# Patient Record
Sex: Male | Born: 1980 | Race: Black or African American | Hispanic: No | Marital: Single | Smoking: Current every day smoker
Health system: Southern US, Community
[De-identification: ages and names within clinical notes are randomized; demographics above are authoritative.]

---

## 2005-06-11 ENCOUNTER — Emergency Department: Payer: Self-pay | Admitting: Emergency Medicine

## 2005-12-03 ENCOUNTER — Other Ambulatory Visit: Payer: Self-pay

## 2005-12-03 ENCOUNTER — Emergency Department: Payer: Self-pay | Admitting: Internal Medicine

## 2008-10-11 ENCOUNTER — Emergency Department: Payer: Self-pay | Admitting: Emergency Medicine

## 2009-12-04 ENCOUNTER — Emergency Department: Payer: Self-pay | Admitting: Unknown Physician Specialty

## 2010-02-06 ENCOUNTER — Emergency Department: Payer: Self-pay | Admitting: Emergency Medicine

## 2012-08-02 ENCOUNTER — Emergency Department: Payer: Self-pay | Admitting: Emergency Medicine

## 2012-08-02 LAB — RAPID INFLUENZA A&B ANTIGENS

## 2013-09-23 ENCOUNTER — Emergency Department: Payer: Self-pay | Admitting: Emergency Medicine

## 2013-09-25 ENCOUNTER — Emergency Department: Payer: Self-pay | Admitting: Emergency Medicine

## 2013-09-25 LAB — URINALYSIS, COMPLETE
Bilirubin,UR: NEGATIVE
Blood: NEGATIVE
Glucose,UR: NEGATIVE mg/dL (ref 0–75)
Ketone: NEGATIVE
Leukocyte Esterase: NEGATIVE
NITRITE: NEGATIVE
Ph: 5 (ref 4.5–8.0)
Protein: 30
RBC,UR: 1 /HPF (ref 0–5)
SPECIFIC GRAVITY: 1.029 (ref 1.003–1.030)
Squamous Epithelial: 1
WBC UR: 3 /HPF (ref 0–5)

## 2013-09-25 LAB — CBC WITH DIFFERENTIAL/PLATELET
BASOS ABS: 0 10*3/uL (ref 0.0–0.1)
BASOS PCT: 0.4 %
EOS ABS: 0.1 10*3/uL (ref 0.0–0.7)
EOS PCT: 1.4 %
HCT: 50.7 % (ref 40.0–52.0)
HGB: 16.7 g/dL (ref 13.0–18.0)
LYMPHS ABS: 1 10*3/uL (ref 1.0–3.6)
LYMPHS PCT: 14.1 %
MCH: 30.7 pg (ref 26.0–34.0)
MCHC: 33 g/dL (ref 32.0–36.0)
MCV: 93 fL (ref 80–100)
MONOS PCT: 17 %
Monocyte #: 1.2 x10 3/mm — ABNORMAL HIGH (ref 0.2–1.0)
Neutrophil #: 4.9 10*3/uL (ref 1.4–6.5)
Neutrophil %: 67.1 %
Platelet: 251 10*3/uL (ref 150–440)
RBC: 5.46 10*6/uL (ref 4.40–5.90)
RDW: 12.7 % (ref 11.5–14.5)
WBC: 7.3 10*3/uL (ref 3.8–10.6)

## 2013-09-25 LAB — COMPREHENSIVE METABOLIC PANEL
AST: 25 U/L (ref 15–37)
Albumin: 4.1 g/dL (ref 3.4–5.0)
Alkaline Phosphatase: 98 U/L
Anion Gap: 6 — ABNORMAL LOW (ref 7–16)
BUN: 12 mg/dL (ref 7–18)
Bilirubin,Total: 0.9 mg/dL (ref 0.2–1.0)
CALCIUM: 9.4 mg/dL (ref 8.5–10.1)
CREATININE: 1.36 mg/dL — AB (ref 0.60–1.30)
Chloride: 101 mmol/L (ref 98–107)
Co2: 28 mmol/L (ref 21–32)
EGFR (African American): >60
EGFR (Non-African Amer.): >60
GLUCOSE: 105 mg/dL — AB (ref 65–99)
Osmolality: 270 (ref 275–301)
Potassium: 3.5 mmol/L (ref 3.5–5.1)
SGPT (ALT): 36 U/L (ref 12–78)
Sodium: 135 mmol/L — ABNORMAL LOW (ref 136–145)
Total Protein: 8.3 g/dL — ABNORMAL HIGH (ref 6.4–8.2)

## 2013-09-25 LAB — TROPONIN I

## 2013-09-25 LAB — LIPASE, BLOOD: Lipase: 147 U/L (ref 73–393)

## 2018-07-20 ENCOUNTER — Emergency Department: Payer: Commercial Managed Care - PPO

## 2018-07-20 ENCOUNTER — Emergency Department
Admission: EM | Admit: 2018-07-20 | Discharge: 2018-07-20 | Disposition: A | Discharge status: Home / Self Care | Payer: Commercial Managed Care - PPO | Attending: Emergency Medicine | Admitting: Emergency Medicine

## 2018-07-20 ENCOUNTER — Encounter: Payer: Self-pay | Admitting: Medical Oncology

## 2018-07-20 DIAGNOSIS — J189 Pneumonia, unspecified organism: Secondary | ICD-10-CM | POA: Diagnosis not present

## 2018-07-20 DIAGNOSIS — J111 Influenza due to unidentified influenza virus with other respiratory manifestations: Secondary | ICD-10-CM | POA: Insufficient documentation

## 2018-07-20 DIAGNOSIS — R05 Cough: Secondary | ICD-10-CM | POA: Diagnosis present

## 2018-07-20 DIAGNOSIS — J101 Influenza due to other identified influenza virus with other respiratory manifestations: Secondary | ICD-10-CM

## 2018-07-20 LAB — COMPREHENSIVE METABOLIC PANEL
ALT: 37 U/L (ref 0–44)
AST: 75 U/L — ABNORMAL HIGH (ref 15–41)
Albumin: 3.6 g/dL (ref 3.5–5.0)
Alkaline Phosphatase: 74 U/L (ref 38–126)
Anion gap: 6 (ref 5–15)
BUN: 12 mg/dL (ref 6–20)
CO2: 25 mmol/L (ref 22–32)
Calcium: 8.5 mg/dL — ABNORMAL LOW (ref 8.9–10.3)
Chloride: 102 mmol/L (ref 98–111)
Creatinine, Ser: 1.09 mg/dL (ref 0.61–1.24)
GFR calc Af Amer: >60 mL/min (ref >60)
GFR calc non Af Amer: >60 mL/min (ref >60)
GLUCOSE: 99 mg/dL (ref 70–99)
Potassium: 3.6 mmol/L (ref 3.5–5.1)
Sodium: 133 mmol/L — ABNORMAL LOW (ref 135–145)
TOTAL PROTEIN: 7.7 g/dL (ref 6.5–8.1)
Total Bilirubin: 1.1 mg/dL (ref 0.3–1.2)

## 2018-07-20 LAB — CBC
HCT: 42.7 % (ref 39.0–52.0)
Hemoglobin: 14.3 g/dL (ref 13.0–17.0)
MCH: 30.5 pg (ref 26.0–34.0)
MCHC: 33.5 g/dL (ref 30.0–36.0)
MCV: 91 fL (ref 80.0–100.0)
Platelets: 234 10*3/uL (ref 150–400)
RBC: 4.69 MIL/uL (ref 4.22–5.81)
RDW: 11.4 % — ABNORMAL LOW (ref 11.5–15.5)
WBC: 8.6 10*3/uL (ref 4.0–10.5)
nRBC: 0 % (ref 0.0–0.2)

## 2018-07-20 LAB — INFLUENZA PANEL BY PCR (TYPE A & B)
INFLBPCR: NEGATIVE
Influenza A By PCR: POSITIVE — AB

## 2018-07-20 LAB — TROPONIN I: Troponin I: <0.03 ng/mL (ref <0.03)

## 2018-07-20 MED ORDER — ACETAMINOPHEN 500 MG PO TABS
1000.0000 mg | ORAL_TABLET | Freq: Once | ORAL | Status: AC
  Administered 2018-07-20: 1000 mg via ORAL
  Filled 2018-07-20: qty 2

## 2018-07-20 MED ORDER — KETOROLAC TROMETHAMINE 30 MG/ML IJ SOLN
30.0000 mg | Freq: Once | INTRAMUSCULAR | Status: AC
  Administered 2018-07-20: 30 mg via INTRAVENOUS
  Filled 2018-07-20: qty 1

## 2018-07-20 MED ORDER — ONDANSETRON 4 MG PO TBDP: 4.0000 mg | ORAL_TABLET | Freq: Three times a day (TID) | ORAL | 0 refills | Status: DC | PRN

## 2018-07-20 MED ORDER — ONDANSETRON HCL 4 MG/2ML IJ SOLN
4.0000 mg | Freq: Once | INTRAMUSCULAR | Status: AC
  Administered 2018-07-20: 4 mg via INTRAVENOUS
  Filled 2018-07-20: qty 2

## 2018-07-20 MED ORDER — GUAIFENESIN-CODEINE 100-10 MG/5ML PO SOLN: 5.0000 mL | Freq: Four times a day (QID) | ORAL | 0 refills | Status: DC | PRN

## 2018-07-20 MED ORDER — AZITHROMYCIN 250 MG PO TABS: ORAL_TABLET | ORAL | 0 refills | Status: AC

## 2018-07-20 MED ORDER — OSELTAMIVIR PHOSPHATE 75 MG PO CAPS: 75.0000 mg | ORAL_CAPSULE | Freq: Two times a day (BID) | ORAL | 0 refills | Status: AC

## 2018-07-20 MED ORDER — SODIUM CHLORIDE 0.9 % IV BOLUS
1000.0000 mL | Freq: Once | INTRAVENOUS | Status: AC
  Administered 2018-07-20: 1000 mL via INTRAVENOUS

## 2018-07-20 NOTE — ED Triage Notes (Signed)
Pt reports that he began a couple weeks ago with cold sx's. States that this am he began having NVD and felt like he was going to pass out. Pt also reports generalized chest pain. Multiple complaints.

## 2018-07-20 NOTE — Discharge Instructions (Addendum)
As we discussed please take your antibiotic and Tamiflu for the next 5 days as written.  Please use your cough medication and nausea medication as needed, but only as prescribed.  Please use Tylenol or ibuprofen every 6 hours at home for fever or discomfort.  Please drink plenty of fluids.  Please avoid contact with others until your fever is gone.

## 2018-07-20 NOTE — ED Provider Notes (Signed)
[facility] Emergency Department Provider Note  Time seen: 7:45 AM  I have reviewed the triage vital signs and the nursing notes.   HISTORY  Chief Complaint Generalized Body Aches; Emesis; Dizziness; and Chest Pain    HPI Craig Watkins is a 38 y.o. male no significant past medical history presents to the emergency department for various complaints of cough, congestion, chest discomfort, nausea vomiting subjective fever.  According to the patient for the past 2 weeks he has had cold-like symptoms with cough and congestion, states he thought he was getting better until 2 days ago when he began with fever, vomiting, worsening cough and developed mild chest discomfort as well as body aches.  States his fiance developed similar symptoms 3 to 4 days ago and was diagnosed with influenza.  States his son is at home and developed a fever this morning.   History reviewed. No pertinent past medical history.  There are no active problems to display for this patient.   Prior to Admission medications   Not on File    No Known Allergies  No family history on file.  Social History Social History   Tobacco Use  . Smoking status: Not on file  Substance Use Topics  . Alcohol use: Not on file  . Drug use: Not on file    Review of Systems Constitutional: Subjective fever since yesterday ENT: Positive for congestion x2 weeks Cardiovascular: Mild chest discomfort Respiratory: Mild shortness of breath.  Positive for frequent cough. Gastrointestinal: Negative for abdominal pain positive for vomiting.  Loose stool. Genitourinary: Negative for urinary compaints Musculoskeletal: Negative for musculoskeletal complaints Skin: Negative for skin complaints  Neurological: Negative for headache All other ROS negative  ____________________________________________   PHYSICAL EXAM:  VITAL SIGNS: ED Triage Vitals  Enc Vitals Group     BP 07/20/18 0734 137/85   Pulse Rate 07/20/18 0734 (!) 114     Resp 07/20/18 0734 18     Temp 07/20/18 0734 99.7 F (37.6 C)     Temp Source 07/20/18 0734 Oral     SpO2 07/20/18 0734 97 %     Weight 07/20/18 0735 200 lb (90.7 kg)     Height 07/20/18 0735 6' (1.829 m)     Head Circumference --      Peak Flow --      Pain Score 07/20/18 0735 7     Pain Loc --      Pain Edu? --      Excl. in GC? --    Constitutional: Alert and oriented. Well appearing and in no distress. Eyes: Normal exam ENT   Head: Normocephalic and atraumatic.   Mouth/Throat: Mucous membranes are moist. Cardiovascular: Normal rate, regular rhythm around 100 bpm.  No obvious murmur. Respiratory: Normal respiratory effort without tachypnea nor retractions.  Frequent cough during exam no obvious rhonchi. Gastrointestinal: Soft and nontender. No distention. Musculoskeletal: Nontender with normal range of motion in all extremities.  Neurologic:  Normal speech and language. No gross focal neurologic deficits are appreciated. Skin:  Skin is warm, dry and intact.  Psychiatric: Mood and affect are normal. Speech and behavior are normal.   ____________________________________________    EKG  EKG viewed and interpreted by myself shows sinus tachycardia 114 bpm with a narrow QRS, normal axis, normal intervals, no concerning ST changes  ____________________________________________    RADIOLOGY  Patchy opacities favoring atypical pneumonia  ____________________________________________   INITIAL IMPRESSION / ASSESSMENT AND PLAN / ED COURSE  Pertinent labs &  imaging results that were available during my care of the patient were reviewed by me and considered in my medical decision making (see chart for details).  Patient presents emergency department with complaints of cough, congestion, body aches, chest pain, nausea vomiting, loose stool subjective fever.  Differential would include influenza, viral syndrome, pneumonia, URI, ACS.  We  will check labs, treat with Toradol Zofran and Tylenol.  We will IV hydrate and continue to closely monitor while awaiting results.  Patient agreeable to plan of care.  Patient's lab results show influenza A positive.  Patient's chest x-ray shows atypical pneumonia, possible viral pneumonia.  Overall patient states he feels much better.  We will plan to discharge home with Tamiflu, cover with Zithromax, discussed supportive care I also discussed return precautions for any trouble breathing or worsening condition.  Patient agreeable to plan of care  ____________________________________________   FINAL CLINICAL IMPRESSION(S) / ED DIAGNOSES  Influenza Pneumonia   Minna Antis, MD 07/20/18 [phone]

## 2019-02-02 ENCOUNTER — Other Ambulatory Visit: Payer: Self-pay

## 2019-02-02 DIAGNOSIS — Z20822 Contact with and (suspected) exposure to covid-19: Secondary | ICD-10-CM

## 2019-02-04 LAB — NOVEL CORONAVIRUS, NAA: SARS-CoV-2, NAA: NOT DETECTED

## 2020-02-19 ENCOUNTER — Emergency Department: Payer: Commercial Managed Care - PPO

## 2020-02-19 ENCOUNTER — Other Ambulatory Visit: Payer: Self-pay

## 2020-02-19 DIAGNOSIS — Y999 Unspecified external cause status: Secondary | ICD-10-CM | POA: Diagnosis not present

## 2020-02-19 DIAGNOSIS — Z041 Encounter for examination and observation following transport accident: Secondary | ICD-10-CM | POA: Diagnosis not present

## 2020-02-19 DIAGNOSIS — M545 Low back pain: Secondary | ICD-10-CM | POA: Insufficient documentation

## 2020-02-19 DIAGNOSIS — Y9389 Activity, other specified: Secondary | ICD-10-CM | POA: Insufficient documentation
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
## 2020-02-20 ENCOUNTER — Emergency Department: Payer: Commercial Managed Care - PPO

## 2020-02-20 ENCOUNTER — Emergency Department
Admission: EM | Admit: 2020-02-20 | Discharge: 2020-02-20 | Disposition: A | Discharge status: Home / Self Care | Payer: Commercial Managed Care - PPO | Attending: Emergency Medicine | Admitting: Emergency Medicine

## 2020-02-20 ENCOUNTER — Encounter: Payer: Self-pay | Admitting: Emergency Medicine

## 2020-02-20 DIAGNOSIS — M545 Low back pain, unspecified: Secondary | ICD-10-CM

## 2020-02-20 MED ORDER — LIDOCAINE 5 % EX PTCH: 1.0000 | MEDICATED_PATCH | Freq: Two times a day (BID) | CUTANEOUS | 0 refills | Status: AC

## 2020-02-20 MED ORDER — CYCLOBENZAPRINE HCL 5 MG PO TABS: 5.0000 mg | ORAL_TABLET | Freq: Three times a day (TID) | ORAL | 0 refills | Status: DC | PRN
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
## 2020-04-12 IMAGING — CR DG CHEST 2V
2 series · 2 of 2 positions shown · non-contrast
Comparison: 12/03/2005

CLINICAL DATA: Chest pain that began a few weeks ago with cold
symptoms. Nausea and vomiting with diarrhea.

EXAM:
CHEST - 2 VIEW

[chest pa]
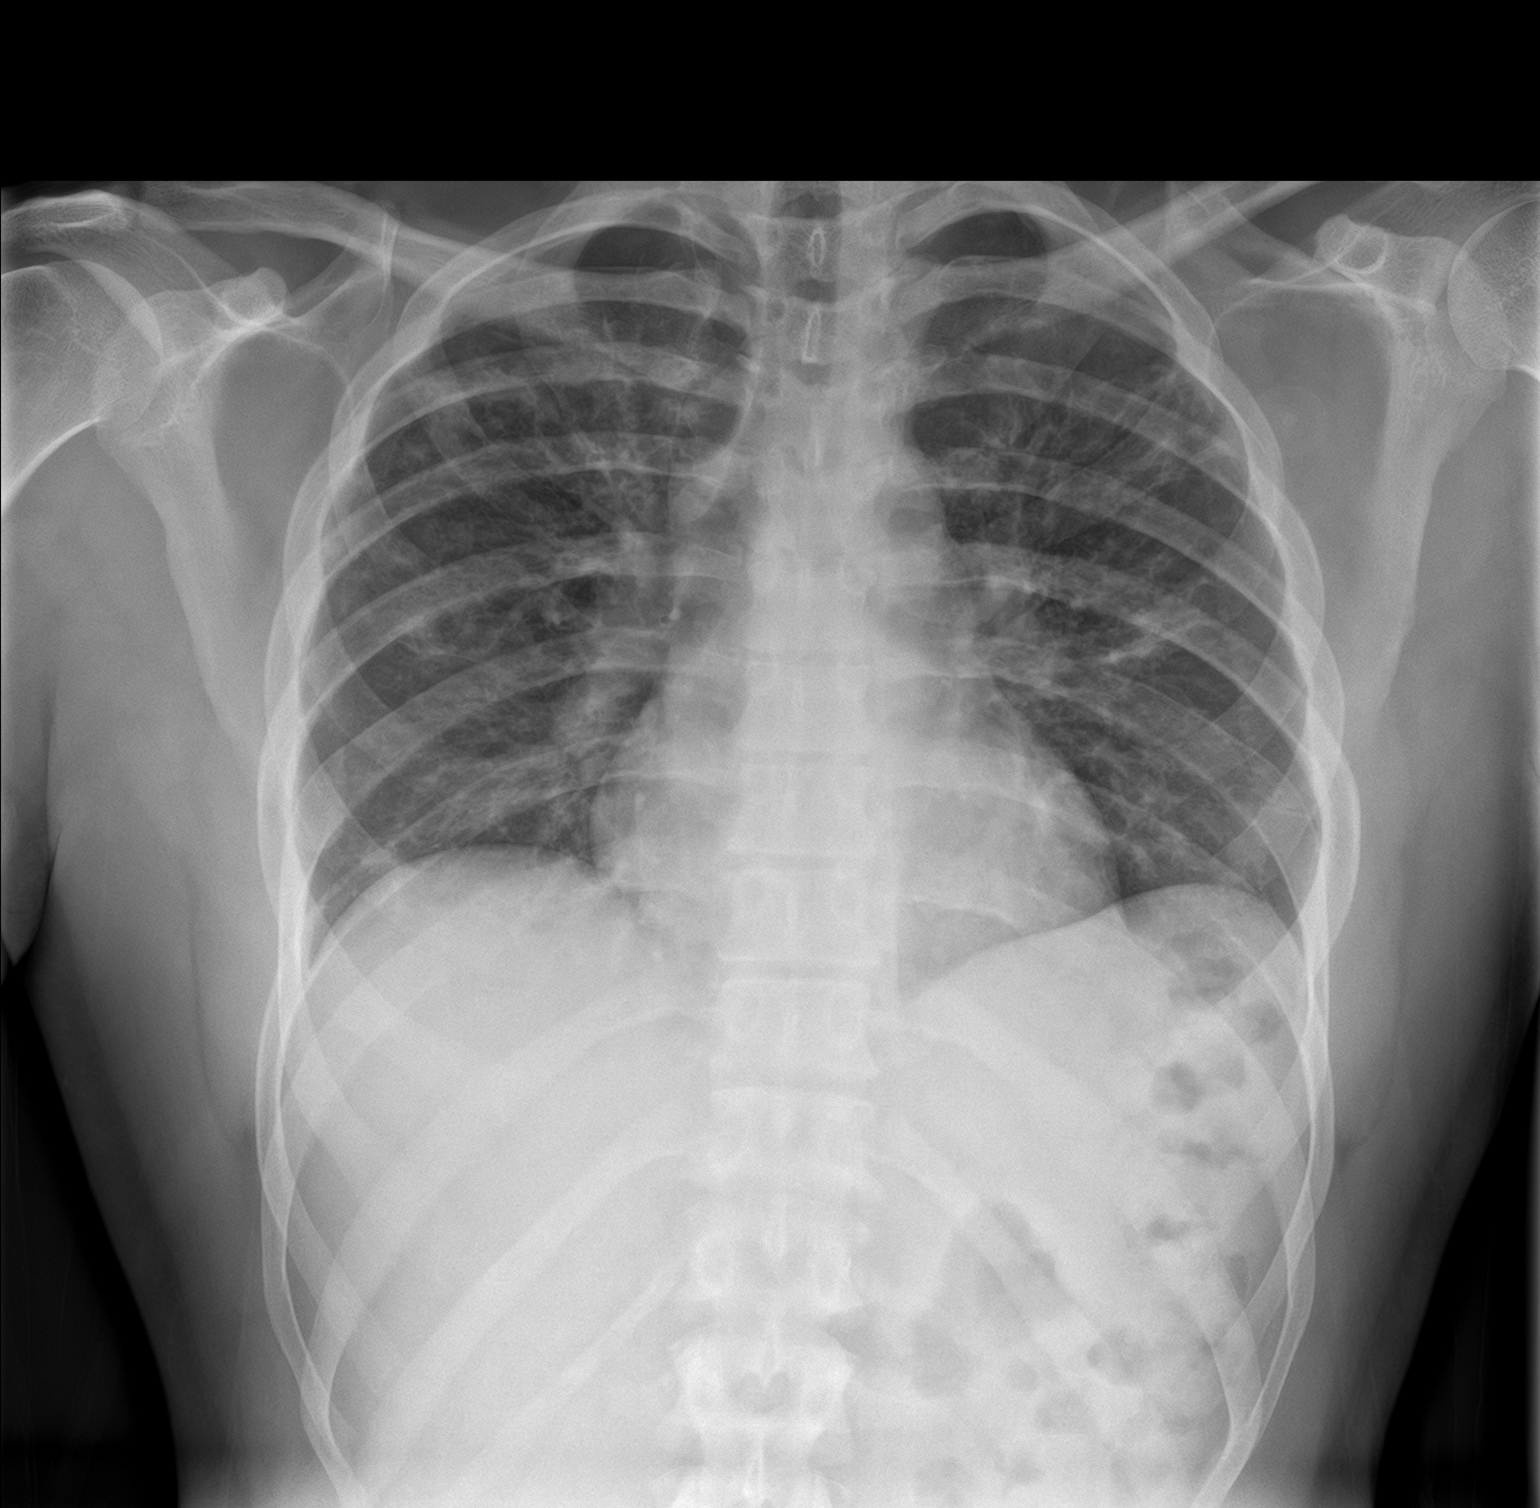

[chest lat]
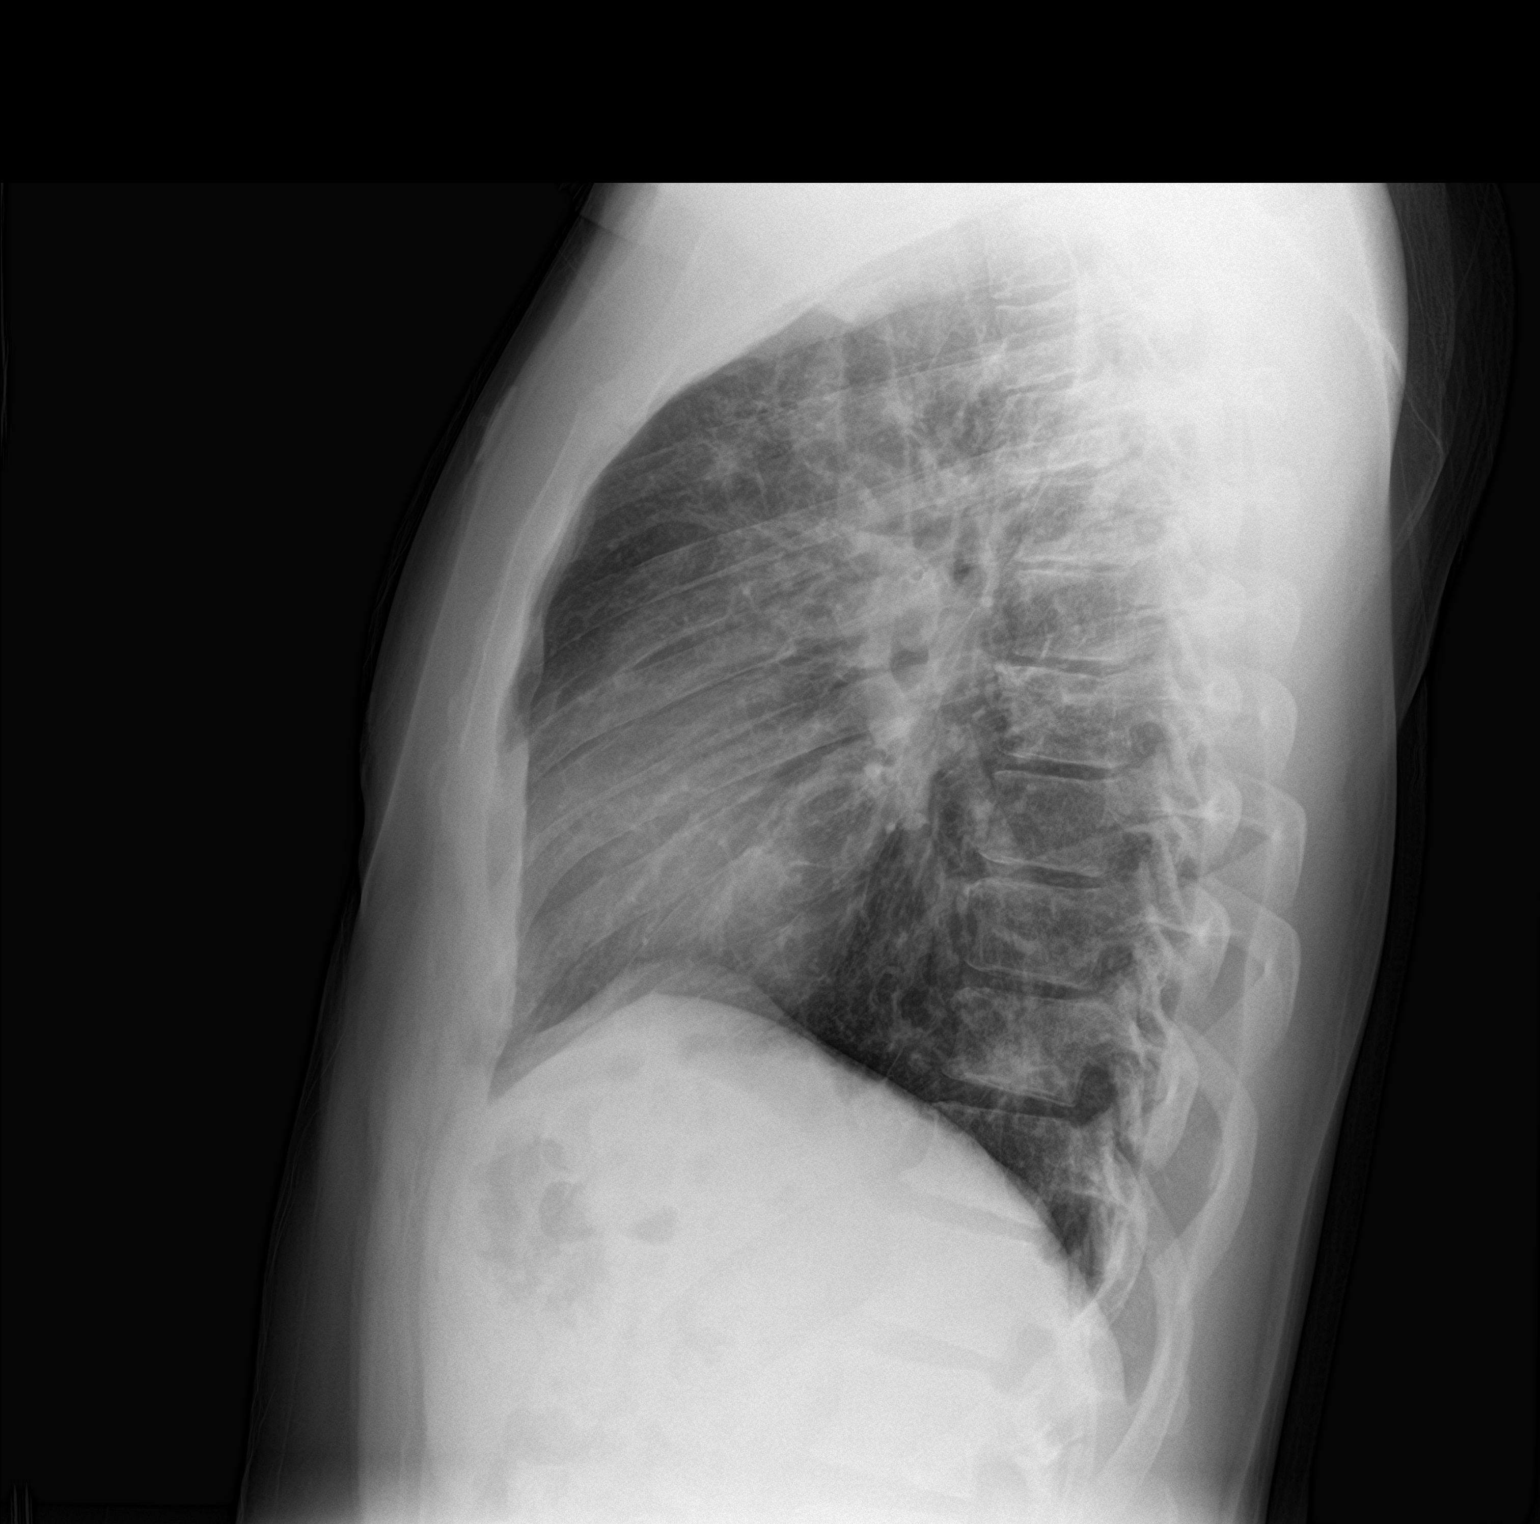

[2 of 2 positions shown; findings below may reference images not displayed]

FINDINGS: Patchy, streaky bilateral lung opacity. No Kerley lines, effusion,
or visible cavitation. Normal heart size and mediastinal contours.
IMPRESSION: Patchy bilateral lung opacity, favor atypical pneumonia in the
appropriate clinical setting.
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
## 2023-01-14 ENCOUNTER — Emergency Department
Admission: EM | Admit: 2023-01-14 | Discharge: 2023-01-14 | Disposition: A | Discharge status: Home / Self Care | Payer: BC Managed Care – PPO | Attending: Emergency Medicine | Admitting: Emergency Medicine

## 2023-01-14 ENCOUNTER — Encounter: Payer: Self-pay | Admitting: Emergency Medicine

## 2023-01-14 ENCOUNTER — Other Ambulatory Visit: Payer: Self-pay

## 2023-01-14 DIAGNOSIS — M722 Plantar fascial fibromatosis: Secondary | ICD-10-CM | POA: Insufficient documentation

## 2023-01-14 DIAGNOSIS — M79672 Pain in left foot: Secondary | ICD-10-CM | POA: Diagnosis present

## 2023-01-14 MED ORDER — HYDROCODONE-ACETAMINOPHEN 5-325 MG PO TABS: 1.0000 | ORAL_TABLET | Freq: Four times a day (QID) | ORAL | 0 refills | Status: AC | PRN

## 2023-01-14 MED ORDER — PREDNISONE 10 MG (21) PO TBPK: ORAL_TABLET | ORAL | 0 refills | Status: DC

## 2023-01-14 NOTE — ED Provider Notes (Signed)
[facility] Provider Note    Event Date/Time   First MD Initiated Contact with Patient 01/14/23 1005     (approximate)   History   Foot Pain   HPI  Craig Watkins is a 42 y.o. male with no significant past medical history and as listed in EMR presents to the emergency department for treatment and evaluation of nontraumatic left foot pain that started about 3 days ago.  Pain has significantly worsened overnight.  Pain worsens at rest and is extremely painful when he puts his foot on the floor in the morning.  He works 2 separate jobs loading boxes.  He denies injury.  No relief with over-the-counter medications..      Physical Exam   Triage Vital Signs: ED Triage Vitals  Enc Vitals Group     BP 01/14/23 0949 (!) 154/93     Pulse Rate 01/14/23 0949 79     Resp 01/14/23 0949 16     Temp --      Temp src --      SpO2 01/14/23 0949 96 %     Weight 01/14/23 0941 187 lb (84.8 kg)     Height 01/14/23 0941 6' (1.829 m)     Head Circumference --      Peak Flow --      Pain Score 01/14/23 0941 10     Pain Loc --      Pain Edu? --      Excl. in GC? --     Most recent vital signs: Vitals:   01/14/23 0949 01/14/23 1035  BP: (!) 154/93 134/87  Pulse: 79 72  Resp: 16   SpO2: 96% 100%    General: Awake, no distress.  CV:  Good peripheral perfusion.  Resp:  Normal effort.  Abd:  No distention.  Other:  Left foot pain induced with heel squeeze.  No open wounds or injury.  No areas of erythema.  Patient is able to demonstrate range of motion of the left ankle and toes.   ED Results / Procedures / Treatments   Labs (all labs ordered are listed, but only abnormal results are displayed) Labs Reviewed - No data to display   EKG  Not indicated   RADIOLOGY  Image and radiology report reviewed and interpreted by me. Radiology report consistent with the same.  Not indicated  PROCEDURES:  Critical Care performed:  No  Procedures   MEDICATIONS ORDERED IN ED:  Medications - No data to display   IMPRESSION / MDM / ASSESSMENT AND PLAN / ED COURSE   I have reviewed the triage note.  Differential diagnosis includes, but is not limited to, tendinitis, plantar fasciitis, gout  Patient's presentation is most consistent with acute, uncomplicated illness.  42 year old male presenting to the emergency department for treatment and evaluation of foot pain.  See HPI for further details.  On exam, he does have pain with heel squeeze.  Area of pain is from midfoot to heel.  Joints of the left foot are without erythema or indication of gout.  No open wounds or lesions are noted.  Symptoms most consistent with plantar fasciitis.  This was discussed in length with the patient.  It was recommended that he purchase a night splint and will get insoles for his work boots.  He will be prescribed meloxicam to take daily and a short course of Norco to take if he is not working and pain is severe.  He was advised not  to take any additional NSAIDs but was advised that he can take Tylenol.  He will be given a referral to podiatry and was strongly encouraged to schedule an appointment.      FINAL CLINICAL IMPRESSION(S) / ED DIAGNOSES   Final diagnoses:  Plantar fasciitis     Rx / DC Orders   ED Discharge Orders          Ordered    predniSONE (STERAPRED UNI-PAK 21 TAB) 10 MG (21) TBPK tablet        01/14/23 1045    HYDROcodone-acetaminophen (NORCO/VICODIN) 5-325 MG tablet  Every 6 hours PRN        01/14/23 1045             Note:  This document was prepared using Dragon voice recognition software and may include unintentional dictation errors.   Chinita Pester, FNP 01/14/23 1427    Jene Every, MD 01/14/23 1510

## 2023-01-14 NOTE — ED Triage Notes (Signed)
Pt c/o left foot pain that started 3 days ago but got worse last night. Pt denies any injury to the area. Pt is in NAD.

## 2023-01-14 NOTE — ED Notes (Signed)
41 yom with a c/c of left sided foot pain since Saturday. The pt advised he is hurting in the arch of his foot. The pt denies any injury to his foot.

## 2024-07-24 ENCOUNTER — Other Ambulatory Visit: Payer: Self-pay

## 2024-07-24 ENCOUNTER — Emergency Department: Payer: Self-pay

## 2024-07-24 ENCOUNTER — Emergency Department
Admission: EM | Admit: 2024-07-24 | Discharge: 2024-07-24 | Disposition: A | Discharge status: Home / Self Care | Payer: Self-pay | Attending: Emergency Medicine | Admitting: Emergency Medicine

## 2024-07-24 DIAGNOSIS — R5383 Other fatigue: Secondary | ICD-10-CM | POA: Diagnosis not present

## 2024-07-24 DIAGNOSIS — I2694 Multiple subsegmental pulmonary emboli without acute cor pulmonale: Secondary | ICD-10-CM | POA: Diagnosis not present

## 2024-07-24 DIAGNOSIS — F17200 Nicotine dependence, unspecified, uncomplicated: Secondary | ICD-10-CM | POA: Insufficient documentation

## 2024-07-24 DIAGNOSIS — J189 Pneumonia, unspecified organism: Secondary | ICD-10-CM | POA: Insufficient documentation

## 2024-07-24 DIAGNOSIS — R0789 Other chest pain: Secondary | ICD-10-CM | POA: Diagnosis present

## 2024-07-24 LAB — CBC
HCT: 45.6 % (ref 39.0–52.0)
Hemoglobin: 15.2 g/dL (ref 13.0–17.0)
MCH: 31.1 pg (ref 26.0–34.0)
MCHC: 33.3 g/dL (ref 30.0–36.0)
MCV: 93.4 fL (ref 80.0–100.0)
Platelets: 265 K/uL (ref 150–400)
RBC: 4.88 MIL/uL (ref 4.22–5.81)
RDW: 12 % (ref 11.5–15.5)
WBC: 7.5 K/uL (ref 4.0–10.5)
nRBC: 0 % (ref 0.0–0.2)

## 2024-07-24 LAB — RESP PANEL BY RT-PCR (RSV, FLU A&B, COVID)  RVPGX2
Influenza A by PCR: NEGATIVE
Influenza B by PCR: NEGATIVE
Resp Syncytial Virus by PCR: NEGATIVE
SARS Coronavirus 2 by RT PCR: NEGATIVE

## 2024-07-24 LAB — BASIC METABOLIC PANEL WITH GFR
Anion gap: 10 (ref 5–15)
BUN: 14 mg/dL (ref 6–20)
CO2: 24 mmol/L (ref 22–32)
Calcium: 9.1 mg/dL (ref 8.9–10.3)
Chloride: 104 mmol/L (ref 98–111)
Creatinine, Ser: 1.15 mg/dL (ref 0.61–1.24)
GFR, Estimated: >60 mL/min
Glucose, Bld: 122 mg/dL — ABNORMAL HIGH (ref 70–99)
Potassium: 4.1 mmol/L (ref 3.5–5.1)
Sodium: 137 mmol/L (ref 135–145)

## 2024-07-24 LAB — D-DIMER, QUANTITATIVE: D-Dimer, Quant: 0.48 ug{FEU}/mL (ref 0.00–0.50)

## 2024-07-24 LAB — TROPONIN T, HIGH SENSITIVITY: Troponin T High Sensitivity: <15 ng/L (ref 0–19)

## 2024-07-24 MED ORDER — APIXABAN 5 MG PO TABS: 5.0000 mg | ORAL_TABLET | Freq: Two times a day (BID) | ORAL | 0 refills | Status: DC

## 2024-07-24 MED ORDER — ACETAMINOPHEN 500 MG PO TABS
1000.0000 mg | ORAL_TABLET | Freq: Once | ORAL | Status: AC
  Administered 2024-07-24: 1000 mg via ORAL
  Filled 2024-07-24: qty 2

## 2024-07-24 MED ORDER — IOHEXOL 350 MG/ML SOLN
75.0000 mL | Freq: Once | INTRAVENOUS | Status: AC | PRN
  Administered 2024-07-24: 75 mL via INTRAVENOUS

## 2024-07-24 MED ORDER — LEVOFLOXACIN 750 MG PO TABS: 750.0000 mg | ORAL_TABLET | Freq: Every day | ORAL | 0 refills | Status: DC

## 2024-07-24 MED ORDER — APIXABAN (ELIQUIS) VTE STARTER PACK (10MG AND 5MG): ORAL_TABLET | ORAL | 0 refills | Status: DC

## 2024-07-24 MED ORDER — APIXABAN (ELIQUIS) EDUCATION KIT FOR DVT/PE PATIENTS: PACK | Freq: Once | Status: DC

## 2024-07-24 MED ORDER — LEVOFLOXACIN 750 MG PO TABS
750.0000 mg | ORAL_TABLET | Freq: Once | ORAL | Status: AC
  Administered 2024-07-24: 750 mg via ORAL
  Filled 2024-07-24: qty 1

## 2024-07-24 MED ORDER — APIXABAN 5 MG PO TABS
10.0000 mg | ORAL_TABLET | Freq: Once | ORAL | Status: AC
  Administered 2024-07-24: 10 mg via ORAL
  Filled 2024-07-24: qty 2

## 2024-07-24 NOTE — ED Triage Notes (Addendum)
 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 Pt reports the chest pain has been constant but worse with breathing. Pt endorses fatigue but no other sx. Pt took aleve a couple of hours ago.

## 2024-07-24 NOTE — ED Provider Notes (Signed)
 "   [facility] Emergency Department Provider Note     Event Date/Time   First MD Initiated Contact with Patient 07/24/24 1615     (approximate)   History   Chest Pain   HPI  Craig Watkins is a 44 y.o. male with a history of current everyday smoker, who presents to the ED endorsing diffuse chest pain with radiation to the left shoulder with onset Friday, 2 days prior to arrival. He describes the pain as throbbing and 7/10. He was working in his office at the time of onset but denies any exertion, fall, or trauma.  He reports constant chest discomfort, but aggravated by breathing and movement.  He also endorses some fatigue and SOB.  No frank cough, congestion, nausea, vomiting, or diaphoresis.  No nausea, vomiting, or diarrhea reported.  Physical Exam   Triage Vital Signs: ED Triage Vitals  Encounter Vitals Group     BP 07/24/24 1605 (!) 167/103     Girls Systolic BP Percentile --      Girls Diastolic BP Percentile --      Boys Systolic BP Percentile --      Boys Diastolic BP Percentile --      Pulse Rate 07/24/24 1605 (!) 112     Resp 07/24/24 1605 18     Temp 07/24/24 1608 99 F (37.2 C)     Temp Source 07/24/24 1608 Oral     SpO2 07/24/24 1605 99 %     Weight 07/24/24 1607 230 lb (104.3 kg)     Height 07/24/24 1607 6' (1.829 m)     Head Circumference --      Peak Flow --      Pain Score 07/24/24 1606 8     Pain Loc --      Pain Education --      Exclude from Growth Chart --     Most recent vital signs: Vitals:   07/24/24 1608 07/24/24 1937  BP:  126/79  Pulse:  100  Resp:  18  Temp: 99 F (37.2 C)   SpO2:  99%    General Awake, no distress. NAD HEENT NCAT. PERRL. EOMI. No rhinorrhea. Mucous membranes are moist.  CV:  Good peripheral perfusion. Tachy rate. Normal S1S2 RESP:  Normal effort. CTA.  No wheeze, rales, or rhonchi noted. ABD:  No distention.  Soft and nontender MSK:  AROM of all extremities   ED Results / Procedures  / Treatments   Labs (all labs ordered are listed, but only abnormal results are displayed) Labs Reviewed  BASIC METABOLIC PANEL WITH GFR - Abnormal; Notable for the following components:      Result Value   Glucose, Bld 122 (*)    All other components within normal limits  RESP PANEL BY RT-PCR (RSV, FLU A&B, COVID)  RVPGX2  CBC  D-DIMER, QUANTITATIVE  TROPONIN T, HIGH SENSITIVITY     EKG  Vent. rate 113 BPM  PR interval 136 ms  QRS duration 82 ms  QT/QTcB 322/441 ms  P-R-T axes 59 65 21  Sinus tachycardia  Otherwise normal ECG  No STEMI  RADIOLOGY  I personally viewed and evaluated these images as part of my medical decision making, as well as reviewing the written report by the radiologist.  ED Provider Interpretation: Chest x-ray with evidence of multifocal pneumonia; CT angio chest, with evidence of multiple subsegmental emboli in the right upper lobe; masslike opacity in RUL; mediastinal lymphadenopathy noted; multifocal consolidations throughout  bilateral lungs  CT Angio Chest PE W and/or Wo Contrast Result Date: 07/24/2024 EXAM: CTA CHEST 07/24/2024 05:44:47 PM TECHNIQUE: CTA of the chest was performed after the administration of 75 mL of iohexol  (OMNIPAQUE ) 350 MG/ML injection. Multiplanar reformatted images are provided for review. MIP images are provided for review. Automated exposure control, iterative reconstruction, and/or weight based adjustment of the mA/kV was utilized to reduce the radiation dose to as low as reasonably achievable. COMPARISON: None available. CLINICAL HISTORY: Pulmonary embolism (PE) suspected, high prob. FINDINGS: PULMONARY ARTERIES: Pulmonary arteries are adequately opacified for evaluation. There are subsegmental pulmonary emboli in the right upper lobe in the region of masslike opacity. No other pulmonary embolism identified. Main pulmonary artery is normal in caliber. MEDIASTINUM: The heart and pericardium demonstrate no acute abnormality. There  is no acute abnormality of the thoracic aorta. LYMPH NODES: Right paratracheal lymphadenopathy measures up to 13 mm. Subcarinal lymphadenopathy measures up to 14 mm. Right hilar lymphadenopathy measures up to 2.1 cm. Left hilar lymphadenopathy measures up to 17 mm. Prevascular lymphadenopathy measures up to 10 mm. LUNGS AND PLEURA: Multifocal slightly nodular/masslike areas of airspace consolidation are seen throughout both lungs. The largest area is in the right upper lobe measuring 6.1 x 3.2 cm abutting the hilum. Second largest area is seen in the left upper lobe measuring 3.9 x 1.4 cm. There are bullae in the right lung apex. No pulmonary edema. No evidence of pleural effusion or pneumothorax. UPPER ABDOMEN: Limited images of the upper abdomen are unremarkable. SOFT TISSUES AND BONES: No acute bone or soft tissue abnormality. IMPRESSION: 1. Subsegmental pulmonary emboli in the right upper lobe in the region of masslike opacity, with no other pulmonary embolism identified. 2. Multifocal slightly nodular/masslike airspace consolidation throughout both lungs, largest in the right upper lobe measuring 6.1 x 3.2 cm and second largest in the left upper lobe measuring 3.9 x 1.4 cm. CT may be related to multifocal infection .neoplastic process cannot be excluded. Please correlate clinically. Short term follow-up CT recommended. 3. Mediastinal and bilateral hilar lymphadenopathy. Electronically signed by: Greig Pique MD 07/24/2024 06:12 PM EST RP Workstation: [id number]   DG Chest 2 View Result Date: 07/24/2024 CLINICAL DATA:  Chest pain radiating to left shoulder. EXAM: CHEST - 2 VIEW COMPARISON:  07/20/2018. FINDINGS: The heart size and mediastinal contours are within normal limits. Scattered airspace disease is present in the lungs bilaterally. No effusion or pneumothorax is seen. No acute osseous abnormality. IMPRESSION: Multifocal airspace disease bilaterally, suspicious for pneumonia. Electronically Signed    By: Leita Birmingham M.D.   On: 07/24/2024 17:17    PROCEDURES:  Critical Care performed: No  Procedures   MEDICATIONS ORDERED IN ED: Medications  apixaban  (ELIQUIS ) Education Kit for DVT/PE patients (has no administration in time range)  iohexol  (OMNIPAQUE ) 350 MG/ML injection 75 mL (75 mLs Intravenous Contrast Given 07/24/24 1734)  acetaminophen  (TYLENOL ) tablet 1,000 mg (1,000 mg Oral Given 07/24/24 1755)  levofloxacin  (LEVAQUIN ) tablet 750 mg (750 mg Oral Given 07/24/24 1936)  apixaban  (ELIQUIS ) tablet 10 mg (10 mg Oral Given 07/24/24 1936)     IMPRESSION / MDM / ASSESSMENT AND PLAN / ED COURSE  I reviewed the triage vital signs and the nursing notes.                              Differential diagnosis includes, but is not limited to, ACS, aortic dissection, pulmonary embolism, cardiac tamponade, pneumothorax, pneumonia, pericarditis, myocarditis,  GI-related causes including esophagitis/gastritis, and musculoskeletal chest wall pain.    Patient's presentation is most consistent with acute complicated illness / injury requiring diagnostic workup.  Patient's diagnosis is consistent with bilateral multifocal pneumonia as well as multiple subsegmental emboli bilaterally.  Patient notes he presented to the ED endorsing some central chest pain with referral to the back.  He was mildly febrile and tachycardic on presentation.  Labs largely reassuring including a negative dimer and flat troponin.  No other lab abnormalities were appreciated.  Viral panel test negative at this time.  Patient's initial chest x-ray confirming multifocal pneumonia, but given his tachycardia as well as chest pain presentation, we thought reasonable to proceed with a CT angio.  We discussed admission with the patient, given his abnormal multifocal pneumonia as well as acute PE.  He ultimately declined admission for ongoing management at this time.  Patient will be discharged home with prescriptions for Eliquis  and  Levaquin .  She is given a Eliquis  starter pack as well as an additional 60-day supply.  Patient is to follow up with urgent referral to a local PCP as suggested as needed or otherwise directed. Patient is given ED precautions to return to the ED for any worsening or new symptoms.  Clinical Course as of 07/24/24 2053  [place] Jul 24, 2024  1730 2 view chest x-ray with evidence of bilateral multifocal airspace disease [JM]  1851 CT angio PE, showing multiple subsegmental emboli bilaterally.  There is no evidence of bilateral multifocal airspace disease. [JM]  1902 Patient made aware of his CT angio findings, confirming a PE, and further confirming his bilateral multifocal pneumonia.  We discussed treatment options and based on the patient's clinical presentation though he stable, we recommended admission for ongoing evaluation and management.  Patient has declined admission at this time. [JM]    Clinical Course User Index [JM] Avrianna Smart, Candida LULLA Kings, PA-C    FINAL CLINICAL IMPRESSION(S) / ED DIAGNOSES   Final diagnoses:  Community acquired pneumonia, unspecified laterality  Multiple subsegmental pulmonary emboli without acute cor pulmonale (HCC)     Rx / DC Orders   ED Discharge Orders          Ordered    Ambulatory Referral to Primary Care (Establish Care)        07/24/24 1833    APIXABAN  (ELIQUIS ) VTE STARTER PACK (10MG  AND 5MG )       Note to Pharmacy: If starter pack unavailable, substitute with seventy-four 5 mg apixaban  tabs following the above SIG directions.   07/24/24 1839    levofloxacin  (LEVAQUIN ) 750 MG tablet  Daily        07/24/24 1839    apixaban  (ELIQUIS ) 5 MG TABS tablet  2 times daily        07/24/24 1945             Note:  This document was prepared using Dragon voice recognition software and may include unintentional dictation errors.    Loyd Candida LULLA Kings, PA-C 07/24/24 2055  "

## 2024-07-24 NOTE — Discharge Instructions (Addendum)
 Your exam, labs, chest x-ray, and chest CT scan, confirmed both an atypical multifocal pneumonia, as well as several small blood clots in the lungs.  You are being treated with blood thinner medication as well as a high-dose antibiotic to treat these 2 conditions.  You must follow-up with a local primary provider for ongoing evaluation.  It is also important that you be reevaluated with a repeat CT scan after your treatment is complete, to confirm resolution of your pneumonia.  You will be on the blood thinning medication for the next 3 months, and your primary provider will determine whether you should continue with this medication beyond 3 months.  You should avoid use of OTC anti-inflammatory medications like ibuprofen, Motrin, Advil, Aleve, naproxen, while taking the blood thinning medication.  Return to the ED immediately for worsening symptoms including chest pain, shortness breath, fevers, coughing up blood, or weakness.

## 2024-07-27 ENCOUNTER — Observation Stay
Admission: EM | Admit: 2024-07-27 | Discharge: 2024-07-29 | Disposition: A | Discharge status: Home / Self Care | Attending: Emergency Medicine | Admitting: Emergency Medicine

## 2024-07-27 ENCOUNTER — Encounter: Payer: Self-pay | Admitting: Internal Medicine

## 2024-07-27 ENCOUNTER — Emergency Department

## 2024-07-27 ENCOUNTER — Other Ambulatory Visit: Payer: Self-pay

## 2024-07-27 DIAGNOSIS — R079 Chest pain, unspecified: Secondary | ICD-10-CM | POA: Diagnosis present

## 2024-07-27 DIAGNOSIS — A419 Sepsis, unspecified organism: Principal | ICD-10-CM | POA: Insufficient documentation

## 2024-07-27 DIAGNOSIS — J189 Pneumonia, unspecified organism: Secondary | ICD-10-CM | POA: Insufficient documentation

## 2024-07-27 DIAGNOSIS — F1090 Alcohol use, unspecified, uncomplicated: Secondary | ICD-10-CM | POA: Insufficient documentation

## 2024-07-27 DIAGNOSIS — F17210 Nicotine dependence, cigarettes, uncomplicated: Secondary | ICD-10-CM | POA: Diagnosis not present

## 2024-07-27 DIAGNOSIS — Z7901 Long term (current) use of anticoagulants: Secondary | ICD-10-CM | POA: Diagnosis not present

## 2024-07-27 DIAGNOSIS — I2699 Other pulmonary embolism without acute cor pulmonale: Secondary | ICD-10-CM | POA: Insufficient documentation

## 2024-07-27 DIAGNOSIS — R918 Other nonspecific abnormal finding of lung field: Secondary | ICD-10-CM

## 2024-07-27 LAB — CBC
HCT: 47.2 % (ref 39.0–52.0)
Hemoglobin: 15.5 g/dL (ref 13.0–17.0)
MCH: 30.5 pg (ref 26.0–34.0)
MCHC: 32.8 g/dL (ref 30.0–36.0)
MCV: 92.9 fL (ref 80.0–100.0)
Platelets: 286 K/uL (ref 150–400)
RBC: 5.08 MIL/uL (ref 4.22–5.81)
RDW: 11.9 % (ref 11.5–15.5)
WBC: 7.9 K/uL (ref 4.0–10.5)
nRBC: 0 % (ref 0.0–0.2)

## 2024-07-27 LAB — TROPONIN T, HIGH SENSITIVITY
Troponin T High Sensitivity: 10 ng/L (ref 0–19)
Troponin T High Sensitivity: 8 ng/L (ref 0–19)

## 2024-07-27 LAB — BASIC METABOLIC PANEL WITH GFR
Anion gap: 11 (ref 5–15)
BUN: 16 mg/dL (ref 6–20)
CO2: 25 mmol/L (ref 22–32)
Calcium: 9.1 mg/dL (ref 8.9–10.3)
Chloride: 101 mmol/L (ref 98–111)
Creatinine, Ser: 1.17 mg/dL (ref 0.61–1.24)
GFR, Estimated: >60 mL/min
Glucose, Bld: 121 mg/dL — ABNORMAL HIGH (ref 70–99)
Potassium: 4 mmol/L (ref 3.5–5.1)
Sodium: 137 mmol/L (ref 135–145)

## 2024-07-27 LAB — LACTIC ACID, PLASMA
Lactic Acid, Venous: 1.5 mmol/L (ref 0.5–1.9)
Lactic Acid, Venous: 1.2 mmol/L (ref 0.5–1.9)

## 2024-07-27 LAB — RESP PANEL BY RT-PCR (RSV, FLU A&B, COVID)  RVPGX2
Influenza A by PCR: NEGATIVE
Influenza B by PCR: NEGATIVE
Resp Syncytial Virus by PCR: NEGATIVE
SARS Coronavirus 2 by RT PCR: NEGATIVE

## 2024-07-27 LAB — PROTIME-INR
INR: 1.1 (ref 0.8–1.2)
Prothrombin Time: 15.3 s — ABNORMAL HIGH (ref 11.4–15.2)

## 2024-07-27 MED ORDER — ALBUTEROL SULFATE (2.5 MG/3ML) 0.083% IN NEBU: 2.5000 mg | INHALATION_SOLUTION | RESPIRATORY_TRACT | Status: DC | PRN

## 2024-07-27 MED ORDER — LACTATED RINGERS IV SOLN: INTRAVENOUS | Status: DC

## 2024-07-27 MED ORDER — ADULT MULTIVITAMIN W/MINERALS CH
1.0000 | ORAL_TABLET | Freq: Every day | ORAL | Status: DC
  Administered 2024-07-27 – 2024-07-29 (×3): 1 via ORAL
  Filled 2024-07-27 (×3): qty 1

## 2024-07-27 MED ORDER — SODIUM CHLORIDE 0.9 % IV SOLN
2.0000 g | Freq: Once | INTRAVENOUS | Status: AC
  Administered 2024-07-27: 2 g via INTRAVENOUS
  Filled 2024-07-27: qty 20

## 2024-07-27 MED ORDER — MORPHINE SULFATE (PF) 2 MG/ML IV SOLN
2.0000 mg | INTRAVENOUS | Status: DC | PRN
  Administered 2024-07-28 (×2): 2 mg via INTRAVENOUS
  Filled 2024-07-27 (×2): qty 1

## 2024-07-27 MED ORDER — THIAMINE MONONITRATE 100 MG PO TABS
100.0000 mg | ORAL_TABLET | Freq: Every day | ORAL | Status: DC
  Administered 2024-07-27 – 2024-07-29 (×3): 100 mg via ORAL
  Filled 2024-07-27 (×3): qty 1

## 2024-07-27 MED ORDER — FOLIC ACID 1 MG PO TABS
1.0000 mg | ORAL_TABLET | Freq: Every day | ORAL | Status: DC
  Administered 2024-07-27 – 2024-07-29 (×3): 1 mg via ORAL
  Filled 2024-07-27 (×3): qty 1

## 2024-07-27 MED ORDER — LORAZEPAM 2 MG/ML IJ SOLN: 1.0000 mg | INTRAMUSCULAR | Status: DC | PRN

## 2024-07-27 MED ORDER — NICOTINE 21 MG/24HR TD PT24
21.0000 mg | MEDICATED_PATCH | Freq: Every day | TRANSDERMAL | Status: DC
  Administered 2024-07-28 – 2024-07-29 (×2): 21 mg via TRANSDERMAL
  Filled 2024-07-27 (×3): qty 1

## 2024-07-27 MED ORDER — LACTATED RINGERS IV BOLUS (SEPSIS)
1000.0000 mL | Freq: Once | INTRAVENOUS | Status: AC
  Administered 2024-07-27: 1000 mL via INTRAVENOUS

## 2024-07-27 MED ORDER — MORPHINE SULFATE (PF) 4 MG/ML IV SOLN
4.0000 mg | Freq: Once | INTRAVENOUS | Status: AC
  Administered 2024-07-27: 4 mg via INTRAVENOUS
  Filled 2024-07-27: qty 1

## 2024-07-27 MED ORDER — SODIUM CHLORIDE 0.9 % IV SOLN
500.0000 mg | Freq: Once | INTRAVENOUS | Status: DC
  Filled 2024-07-27: qty 5

## 2024-07-27 MED ORDER — ONDANSETRON HCL 4 MG/2ML IJ SOLN: 4.0000 mg | Freq: Four times a day (QID) | INTRAMUSCULAR | Status: DC | PRN

## 2024-07-27 MED ORDER — LABETALOL HCL 5 MG/ML IV SOLN: 10.0000 mg | INTRAVENOUS | Status: DC | PRN

## 2024-07-27 MED ORDER — LACTATED RINGERS IV SOLN: 150.0000 mL/h | INTRAVENOUS | Status: DC

## 2024-07-27 MED ORDER — THIAMINE HCL 100 MG/ML IJ SOLN: 100.0000 mg | Freq: Every day | INTRAMUSCULAR | Status: DC

## 2024-07-27 MED ORDER — OXYCODONE HCL 5 MG PO TABS
5.0000 mg | ORAL_TABLET | ORAL | Status: DC | PRN
  Administered 2024-07-28 – 2024-07-29 (×6): 10 mg via ORAL
  Filled 2024-07-27 (×6): qty 2

## 2024-07-27 MED ORDER — SODIUM CHLORIDE 0.9 % IV SOLN: 500.0000 mg | INTRAVENOUS | Status: DC

## 2024-07-27 MED ORDER — HEPARIN (PORCINE) 25000 UT/250ML-% IV SOLN
1300.0000 [IU]/h | INTRAVENOUS | Status: DC
  Administered 2024-07-27: 1700 [IU]/h via INTRAVENOUS
  Administered 2024-07-28: 1300 [IU]/h via INTRAVENOUS
  Filled 2024-07-27 (×2): qty 250

## 2024-07-27 MED ORDER — ACETAMINOPHEN 325 MG PO TABS: 650.0000 mg | ORAL_TABLET | Freq: Four times a day (QID) | ORAL | Status: DC | PRN

## 2024-07-27 MED ORDER — ONDANSETRON HCL 4 MG/2ML IJ SOLN
4.0000 mg | Freq: Once | INTRAMUSCULAR | Status: AC
  Administered 2024-07-27: 4 mg via INTRAVENOUS
  Filled 2024-07-27: qty 2

## 2024-07-27 MED ORDER — LORAZEPAM 1 MG PO TABS: 1.0000 mg | ORAL_TABLET | ORAL | Status: DC | PRN

## 2024-07-27 MED ORDER — AZITHROMYCIN 250 MG PO TABS
500.0000 mg | ORAL_TABLET | Freq: Every day | ORAL | Status: DC
  Administered 2024-07-27 – 2024-07-29 (×3): 500 mg via ORAL
  Filled 2024-07-27 (×3): qty 2

## 2024-07-27 MED ORDER — ONDANSETRON HCL 4 MG PO TABS: 4.0000 mg | ORAL_TABLET | Freq: Four times a day (QID) | ORAL | Status: DC | PRN

## 2024-07-27 MED ORDER — SODIUM CHLORIDE 0.9 % IV SOLN
2.0000 g | INTRAVENOUS | Status: DC
  Administered 2024-07-28: 2 g via INTRAVENOUS
  Filled 2024-07-27 (×2): qty 20

## 2024-07-27 MED ORDER — ACETAMINOPHEN 650 MG RE SUPP: 650.0000 mg | Freq: Four times a day (QID) | RECTAL | Status: DC | PRN

## 2024-07-27 NOTE — ED Triage Notes (Signed)
 Pt states he was here 2 days ago and diagnosed with PNA and PE. States they wanted him to stay but he needed to go home. Pain and SOB worsened. Pt has been compliant with meds.

## 2024-07-27 NOTE — H&P (Signed)
 " History and Physical    Craig Watkins FMW:[id number] DOB: 02/22/1981 DOA: 07/27/2024  PCP: Pcp, No (Confirm with patient/family/NH records and if not entered, this has to be entered at [facility] point of entry) Patient coming from: Home  I have personally briefly reviewed patient's old medical records in [facility]  Chief Complaint: Chest pain, SOB  HPI: Craig Watkins is a 44 y.o. male with medical history significant of alcohol use disorder, tobacco use, recently diagnosed pulmonary embolism presents to the emergency room with chief complaint of left-sided chest pain and shortness of breath.  Patient was in the emergency room on 1/18 with similar complaints.  At that time he was diagnosed with a multifocal pneumonia as well as small submitted segmental pulmonary emboli.  Admission was recommended at that time however patient declined citing childcare responsibilities outside of the hospital.  He was discharged on levofloxacin  and Eliquis  for which he endorses compliance however his symptoms have persisted.  Patient presented back to the emergency room on 1/21 with similar complaints.  On evaluation patient is resting comfortably.  He is in no visible distress.  He complains of nausea and persistent left-sided chest pain.  Laboratory workup overall reassuring.  ED Course: Patient was started on intravenous heparin  gtt. and given 1 dose of Rocephin  and azithromycin .  Hospitalist contacted for admission.  Review of Systems: As per HPI otherwise 14 point review of systems negative.    No past medical history on file.  No past surgical history on file.   reports that he has been smoking cigarettes. He has never used smokeless tobacco. No history on file for alcohol use and drug use.  Allergies[1]   No family history of CAP or pulmonary emboli  Prior to Admission medications  Medication Sig Start Date End Date Taking? Authorizing Provider  APIXABAN  (ELIQUIS ) VTE STARTER PACK (10MG   AND 5MG ) Take as directed on package: start with two-5mg  tablets twice daily for 7 days. On day 8, switch to one-5mg  tablet twice daily. 07/24/24  Yes Menshew, Candida LULLA Kings, PA-C  levofloxacin  (LEVAQUIN ) 750 MG tablet Take 1 tablet (750 mg total) by mouth daily for 7 days. 07/24/24 07/31/24 Yes Menshew, Candida LULLA Kings, PA-C  apixaban  (ELIQUIS ) 5 MG TABS tablet Take 1 tablet (5 mg total) by mouth 2 (two) times daily. 07/24/24 09/22/24  Menshew, Candida LULLA Kings, PA-C    Physical Exam: Vitals:   07/27/24 1542 07/27/24 1543  BP: (!) 135/100   Pulse: (!) 128   Resp: (!) 22   Temp: 98.7 F (37.1 C)   TempSrc: Oral   SpO2: 98%   Weight:  104 kg  Height:  6' (1.829 m)   General: No apparent distress, patient appears well HEENT: Normocephalic, atraumatic Neck, supple, trachea midline, no tenderness Heart: Tachycardic, regular rhythm, no murmurs Lungs: Crackles bilaterally worse on the left.  Normal work of breathing.  Room air Abdomen: Soft, nontender, nondistended, positive bowel sounds Extremities: Normal, atraumatic, no clubbing or cyanosis, normal muscle tone Skin: No rashes or lesions, normal color Neurologic: Cranial nerves grossly intact, sensation intact, alert and oriented x3 Psychiatric: Normal affect  Labs on Admission: I have personally reviewed following labs and imaging studies  CBC: Recent Labs  Lab 07/24/24 1608 07/27/24 1545  WBC 7.5 7.9  HGB 15.2 15.5  HCT 45.6 47.2  MCV 93.4 92.9  PLT 265 286   Basic Metabolic Panel: Recent Labs  Lab 07/24/24 1608 07/27/24 1545  NA 137 137  K  4.1 4.0  CL 104 101  CO2 24 25  GLUCOSE 122* 121*  BUN 14 16  CREATININE 1.15 1.17  CALCIUM 9.1 9.1   GFR: Estimated Creatinine Clearance: 103.7 mL/min (by C-G formula based on SCr of 1.17 mg/dL). Liver Function Tests: No results for input(s): AST, ALT, ALKPHOS, BILITOT, PROT, ALBUMIN in the last 168 hours. No results for input(s): LIPASE, AMYLASE in the last 168  hours. No results for input(s): AMMONIA in the last 168 hours. Coagulation Profile: Recent Labs  Lab 07/27/24 1545  INR 1.1   Cardiac Enzymes: No results for input(s): CKTOTAL, CKMB, CKMBINDEX, TROPONINI in the last 168 hours. BNP (last 3 results) No results for input(s): PROBNP in the last 8760 hours. HbA1C: No results for input(s): HGBA1C in the last 72 hours. CBG: No results for input(s): GLUCAP in the last 168 hours. Lipid Profile: No results for input(s): CHOL, HDL, LDLCALC, TRIG, CHOLHDL, LDLDIRECT in the last 72 hours. Thyroid Function Tests: No results for input(s): TSH, T4TOTAL, FREET4, T3FREE, THYROIDAB in the last 72 hours. Anemia Panel: No results for input(s): VITAMINB12, FOLATE, FERRITIN, TIBC, IRON, RETICCTPCT in the last 72 hours. Urine analysis:    Component Value Date/Time   COLORURINE Amber 09/25/2013 0850   APPEARANCEUR Hazy 09/25/2013 0850   LABSPEC 1.029 09/25/2013 0850   PHURINE 5.0 09/25/2013 0850   GLUCOSEU Negative 09/25/2013 0850   HGBUR Negative 09/25/2013 0850   BILIRUBINUR Negative 09/25/2013 0850   KETONESUR Negative 09/25/2013 0850   PROTEINUR 30 mg/dL 96/77/7984 9149   NITRITE Negative 09/25/2013 0850   LEUKOCYTESUR Negative 09/25/2013 0850    Radiological Exams on Admission: DG Chest 2 View Result Date: 07/27/2024 EXAM: 2 VIEW(S) XRAY OF THE CHEST 07/27/2024 03:58:24 PM COMPARISON: 07/24/2024 CLINICAL HISTORY: CP CP CP CP CP FINDINGS: LUNGS AND PLEURA: Patchy airspace disease in the bilateral midlungs, right greater than left. This has not significantly changed from prior. No pleural effusion. No pneumothorax. HEART AND MEDIASTINUM: No acute abnormality of the cardiac and mediastinal silhouettes. BONES AND SOFT TISSUES: No acute osseous abnormality. IMPRESSION: 1. Stable airspace disease in the bilateral mid lungs, right greater than left, worrisome for multifocal pneumonia. Electronically  signed by: Greig Pique MD 07/27/2024 04:02 PM EST RP Workstation: [id number]    EKG: Independently reviewed.  Sinus tachycardia  Assessment/Plan Principal Problem:   Sepsis due to pneumonia (HCC)  Sepsis secondary to pneumonia Sepsis criteria met with tachycardia and tachypnea.  No endorgan damage or elevated lactic acid to suggest severe sepsis.  Of note the infiltrates on chest imaging are unable to exclude malignancy as a notable factor. Plan: Placed observation Rocephin  and azithromycin  Monitor vitals and fever curve Check strep pneumo urinary antigen Legionella urinary antigen Fungitell If patient clinically deteriorates will involve pulmonary and infectious disease for comment  Pulmonary embolism Subsegmental.  Diagnosed on CAT scan 3 days prior to this presentation Plan: Hold apixaban  at this time Start heparin  GTT  Alcohol use disorder Patient endorses use of 6-7 shots daily Has not had any alcohol since 1/18 No overt signs of withdrawal though patient is tachycardic Plan: CIWA protocol with as needed Ativan  Counseled on cessation  Tobacco use Counseled on cessation Offer nicotine  patch  Sinus tachycardia Could be secondary to pulmonary embolism versus infectious process.  Also could be related to mild withdrawals.  Troponins negative Plan: Telemetry monitoring    DVT prophylaxis: IV heparin  Code Status: Full Family Communication: None Disposition Plan: Return to previous home environment Consults called: None at this time  Admission status: Observation, telemetry   Calvin KATHEE Robson MD [facility]   If 7PM-7AM, please contact night-coverage  07/27/2024, 4:57 PM       [1] No Known Allergies  "

## 2024-07-27 NOTE — Sepsis Progress Note (Signed)
 eLink is following this Code Sepsis.

## 2024-07-27 NOTE — ED Provider Notes (Signed)
 "  [facility] Provider Note    Event Date/Time   First MD Initiated Contact with Patient 07/27/24 1556     (approximate)  History   Chief Complaint: Chest Pain  HPI  Craig Watkins is a 44 y.o. male with no significant past medical history who presents to the emergency department for worsening chest pain cough and shortness of breath.  According to the patient over the past week or so he has had cough shortness of breath was seen in the emergency department 3 days ago was diagnosed with what appeared to be multifocal pneumonia in addition to pulmonary embolism.  Patient states he could not be admitted and had to leave.  Patient states however since going home he has had worsening shortness of breath and chest pains that he return to the emergency department today ready to be admitted to the hospital.  Physical Exam   Triage Vital Signs: ED Triage Vitals  Encounter Vitals Group     BP 07/27/24 1542 (!) 135/100     Girls Systolic BP Percentile --      Girls Diastolic BP Percentile --      Boys Systolic BP Percentile --      Boys Diastolic BP Percentile --      Pulse Rate 07/27/24 1542 (!) 128     Resp 07/27/24 1542 (!) 22     Temp 07/27/24 1542 98.7 F (37.1 C)     Temp Source 07/27/24 1542 Oral     SpO2 07/27/24 1542 98 %     Weight 07/27/24 1543 229 lb 4.5 oz (104 kg)     Height 07/27/24 1543 6' (1.829 m)     Head Circumference --      Peak Flow --      Pain Score 07/27/24 1543 8     Pain Loc --      Pain Education --      Exclude from Growth Chart --     Most recent vital signs: Vitals:   07/27/24 1542  BP: (!) 135/100  Pulse: (!) 128  Resp: (!) 22  Temp: 98.7 F (37.1 C)  SpO2: 98%    General: Awake, no distress.  CV:  Good peripheral perfusion.  Regular rhythm rate around 120 bpm Resp:  Normal effort.  Equal breath sounds bilaterally.  No obvious wheeze rales or rhonchi on exam. Abd:  No distention.  Soft, nontender.  No rebound or  guarding.  ED Results / Procedures / Treatments   EKG  EKG viewed and interpreted by myself shows sinus tachycardia at 130 bpm with a narrow QRS, normal axis, normal intervals, no concerning ST changes.  RADIOLOGY  I have reviewed interpret the chest x-ray images.  Patient appears to have bilateral infiltrates right greater than left. Radiology is read and stable airspace disease of bilateral midlung right greater than left worrisome for multifocal pneumonia.   MEDICATIONS ORDERED IN ED: Medications  lactated ringers  infusion (has no administration in time range)  lactated ringers  bolus 1,000 mL (has no administration in time range)  cefTRIAXone  (ROCEPHIN ) 2 g in sodium chloride  0.9 % 100 mL IVPB (has no administration in time range)  azithromycin  (ZITHROMAX ) 500 mg in sodium chloride  0.9 % 250 mL IVPB (has no administration in time range)     IMPRESSION / MDM / ASSESSMENT AND PLAN / ED COURSE  I reviewed the triage vital signs and the nursing notes.  Patient's presentation is most consistent with acute presentation with  potential threat to life or bodily function.  Patient presents to the emergency department for worsening chest pain and shortness of breath.  I reviewed the patient's CT scan from 1/18 showing what appears to be multifocal pneumonia though discussed with the patient that they were unable to rule out a neoplastic process.  Patient also has subsegmental PE.  Patient was discharged on Eliquis  he has been taking the Eliquis  and antibiotic.  Will start the patient on IV antibiotics we will start the patient on IV heparin .  Given the patient's continued tachycardia we will dose pain medication.  Patient's lab work today shows a reassuring CBC with a normal white blood cell count reassuring chemistry normal troponin normal INR.  Admit to the hospitalist service for ongoing workup and treatment.  Patient agreeable to plan of care.  CRITICAL CARE Performed by: Franky Moores   Total critical care time: 30 minutes  Critical care time was exclusive of separately billable procedures and treating other patients.  Critical care was necessary to treat or prevent imminent or life-threatening deterioration.  Critical care was time spent personally by me on the following activities: development of treatment plan with patient and/or surrogate as well as nursing, discussions with consultants, evaluation of patient's response to treatment, examination of patient, obtaining history from patient or surrogate, ordering and performing treatments and interventions, ordering and review of laboratory studies, ordering and review of radiographic studies, pulse oximetry and re-evaluation of patient's condition.   FINAL CLINICAL IMPRESSION(S) / ED DIAGNOSES   Multifocal pneumonia Sepsis Pulmonary embolism   Note:  This document was prepared using Dragon voice recognition software and may include unintentional dictation errors.   Moores Franky, MD 07/27/24 1644  "

## 2024-07-27 NOTE — ED Notes (Signed)
 Xray will take pt to room after scan

## 2024-07-27 NOTE — Progress Notes (Signed)
 PHARMACY - ANTICOAGULATION CONSULT NOTE  Pharmacy Consult for Heparin  infusion Indication: pulmonary embolus  Allergies[1]  Patient Measurements: Height: 6' (182.9 cm) Weight: 104 kg (229 lb 4.5 oz) IBW/kg (Calculated) : 77.6 HEPARIN  DW (KG): 99.1  Vital Signs: Temp: 98.7 F (37.1 C) (01/21 1542) Temp Source: Oral (01/21 1542) BP: 135/100 (01/21 1542) Pulse Rate: 128 (01/21 1542)  Labs: Recent Labs    07/27/24 1545  HGB 15.5  HCT 47.2  PLT 286  LABPROT 15.3*  INR 1.1  CREATININE 1.17    Estimated Creatinine Clearance: 103.7 mL/min (by C-G formula based on SCr of 1.17 mg/dL).   Medical History: No past medical history on file.  Medications:  (Not in a hospital admission)   Assessment: 44 y/o M recently seen in the ED on 1/18 and discharged on apixaban  for PE now being evaluated in the ED for worsening chest pain and shortness of breath.   Last dose of apixaban  was this AM per patient (10 mg)  Goal of Therapy:  Heparin  level 0.3-0.7 units/ml aPTT 66-102 seconds Monitor platelets by anticoagulation protocol: Yes   Plan:  Start heparin  infusion at 1700 units/hr at 2200.  Check aPTT 6 hours after starting the infusion.  Transition to anti-Xa levels when able Daily CBC/anti-Xa  Bell Carbo D 07/27/2024,4:38 PM       [1] No Known Allergies

## 2024-07-27 NOTE — Progress Notes (Signed)
 CODE SEPSIS - PHARMACY COMMUNICATION  **Broad Spectrum Antibiotics should be administered within 1 hour of Sepsis diagnosis**  Time Code Sepsis Called/Page Received: 1607  Antibiotics Ordered: azithromycin /CRO  Time of 1st antibiotic administration: 1635  Additional action taken by pharmacy:   If necessary, Name of Provider/Nurse Contacted:     Bari Glendia BIRCH ,PharmD Clinical Pharmacist  07/27/2024  5:09 PM

## 2024-07-28 DIAGNOSIS — F1010 Alcohol abuse, uncomplicated: Secondary | ICD-10-CM

## 2024-07-28 DIAGNOSIS — A419 Sepsis, unspecified organism: Secondary | ICD-10-CM | POA: Diagnosis not present

## 2024-07-28 DIAGNOSIS — Z720 Tobacco use: Secondary | ICD-10-CM | POA: Diagnosis not present

## 2024-07-28 DIAGNOSIS — I2699 Other pulmonary embolism without acute cor pulmonale: Secondary | ICD-10-CM

## 2024-07-28 DIAGNOSIS — R918 Other nonspecific abnormal finding of lung field: Secondary | ICD-10-CM | POA: Diagnosis not present

## 2024-07-28 DIAGNOSIS — J189 Pneumonia, unspecified organism: Secondary | ICD-10-CM | POA: Diagnosis not present

## 2024-07-28 LAB — BASIC METABOLIC PANEL WITH GFR
Anion gap: 9 (ref 5–15)
BUN: 14 mg/dL (ref 6–20)
CO2: 26 mmol/L (ref 22–32)
Calcium: 9.1 mg/dL (ref 8.9–10.3)
Chloride: 101 mmol/L (ref 98–111)
Creatinine, Ser: 1.21 mg/dL (ref 0.61–1.24)
GFR, Estimated: >60 mL/min
Glucose, Bld: 94 mg/dL (ref 70–99)
Potassium: 4.5 mmol/L (ref 3.5–5.1)
Sodium: 136 mmol/L (ref 135–145)

## 2024-07-28 LAB — HIV ANTIBODY (ROUTINE TESTING W REFLEX): HIV Screen 4th Generation wRfx: NONREACTIVE

## 2024-07-28 LAB — PROCALCITONIN: Procalcitonin: <0.1 ng/mL

## 2024-07-28 LAB — CBC
HCT: 46.1 % (ref 39.0–52.0)
Hemoglobin: 14.9 g/dL (ref 13.0–17.0)
MCH: 30.5 pg (ref 26.0–34.0)
MCHC: 32.3 g/dL (ref 30.0–36.0)
MCV: 94.5 fL (ref 80.0–100.0)
Platelets: 283 K/uL (ref 150–400)
RBC: 4.88 MIL/uL (ref 4.22–5.81)
RDW: 11.9 % (ref 11.5–15.5)
WBC: 9.2 K/uL (ref 4.0–10.5)
nRBC: 0 % (ref 0.0–0.2)

## 2024-07-28 LAB — STREP PNEUMONIAE URINARY ANTIGEN: Strep Pneumo Urinary Antigen: NEGATIVE

## 2024-07-28 LAB — APTT
aPTT: 131 s — ABNORMAL HIGH (ref 24–36)
aPTT: 108 s — ABNORMAL HIGH (ref 24–36)
aPTT: 99 s — ABNORMAL HIGH (ref 24–36)

## 2024-07-28 LAB — HEPARIN LEVEL (UNFRACTIONATED): Heparin Unfractionated: >1.1 [IU]/mL — ABNORMAL HIGH (ref 0.30–0.70)

## 2024-07-28 LAB — SEDIMENTATION RATE: Sed Rate: 25 mm/h — ABNORMAL HIGH (ref 0–15)

## 2024-07-28 LAB — C-REACTIVE PROTEIN: CRP: 3.2 mg/dL — ABNORMAL HIGH

## 2024-07-28 MED ORDER — HYDROMORPHONE HCL 1 MG/ML IJ SOLN: 0.5000 mg | INTRAMUSCULAR | Status: DC | PRN

## 2024-07-28 NOTE — Progress Notes (Signed)
 " PROGRESS NOTE    DARRIS STAIGER  FMW:[id number] DOB: [birth year] DOA: 07/27/2024 PCP: Pcp, No    Brief Narrative:  44 y.o. male with medical history significant of alcohol use disorder, tobacco use, recently diagnosed pulmonary embolism presents to the emergency room with chief complaint of left-sided chest pain and shortness of breath.  Patient was in the emergency room on 1/18 with similar complaints.  At that time he was diagnosed with a multifocal pneumonia as well as small submitted segmental pulmonary emboli.  Admission was recommended at that time however patient declined citing childcare responsibilities outside of the hospital.   He was discharged on levofloxacin  and Eliquis  for which he endorses compliance however his symptoms have persisted.  Patient presented back to the emergency room on 1/21 with similar complaints.  Assessment & Plan:   Principal Problem:   Sepsis due to pneumonia (HCC)  Sepsis secondary to pneumonia Sepsis criteria met with tachycardia and tachypnea.  No endorgan damage or elevated lactic acid to suggest severe sepsis.  Of note the infiltrates on chest imaging are unable to exclude malignancy as a notable factor.  Patient still endorsing left-sided chest pain and shortness of breath.  Not requiring oxygen at this time. Plan: Recommend inpatient admission Continue Rocephin  and azithromycin  Monitor vitals and fever curve Check strep pneumo urinary antigen, pending Legionella urinary antigen, pending Fungitell, pending    Pulmonary embolism Subsegmental.  Diagnosed on CAT scan 3 days prior to this presentation Still having chest pain and shortness of breath Plan: Hold apixaban  at this time Heparin  GTT Check 2D echocardiogram   Alcohol use disorder Patient endorses use of 6-7 shots daily Has not had any alcohol since 1/18 No overt signs of withdrawal.  Tachycardia improved Plan: Continue CIWA protocol with as needed Ativan  DC tomorrow if no signs  of withdrawal Counseled on cessation   Tobacco use Counseled on cessation Offered nicotine  patch   Sinus tachycardia Could be secondary to pulmonary embolism versus infectious process.  Also could be related to mild withdrawals.  Troponins negative.  Tachycardia improving Plan: Likely DC telemetry monitoring later today    DVT prophylaxis: IV heparin  Code Status: Full Family Communication: None Disposition Plan: Status is: Observation The patient will require care spanning > 2 midnights and should be moved to inpatient because: Symptomatic pulmonary embolism on IV heparin .  CAP on IV antibiotics   Level of care: Telemetry  Consultants:  None  Procedures:  None  Antimicrobials: Rocephin  Azithromycin    Subjective: Seen and examined.  Still endorses chest pain and shortness of breath  Objective: Vitals:   07/28/24 0341 07/28/24 0712 07/28/24 0718 07/28/24 0800  BP: 114/84  119/84   Pulse: 71  78   Resp: 15 16 18 17   Temp: 98.2 F (36.8 C)  98 F (36.7 C)   TempSrc:   Oral   SpO2: 100%  99%   Weight:      Height:        Intake/Output Summary (Last 24 hours) at 07/28/2024 1052 Last data filed at 07/28/2024 1008 Gross per 24 hour  Intake 440 ml  Output --  Net 440 ml   Filed Weights   07/27/24 1543  Weight: 104 kg    Examination:  General exam: Appears calm and comfortable  Respiratory system: Equal air entry.  No oxygen requirement.  Normal work of breathing.  Scattered crackles Cardiovascular system: S1 S2, RRR, no murmurs, no pedal edema Gastrointestinal system: Soft NT/ND, normal bowel sounds Central nervous system:  Alert and oriented. No focal neurological deficits. Extremities: Symmetric 5 x 5 power. Skin: No rashes, lesions or ulcers Psychiatry: Judgement and insight appear normal. Mood & affect appropriate.     Data Reviewed: I have personally reviewed following labs and imaging studies  CBC: Recent Labs  Lab 07/24/24 1608  07/27/24 1545 07/28/24 0428  WBC 7.5 7.9 9.2  HGB 15.2 15.5 14.9  HCT 45.6 47.2 46.1  MCV 93.4 92.9 94.5  PLT 265 286 283   Basic Metabolic Panel: Recent Labs  Lab 07/24/24 1608 07/27/24 1545 07/28/24 0428  NA 137 137 136  K 4.1 4.0 4.5  CL 104 101 101  CO2 24 25 26   GLUCOSE 122* 121* 94  BUN 14 16 14   CREATININE 1.15 1.17 1.21  CALCIUM 9.1 9.1 9.1   GFR: Estimated Creatinine Clearance: 100.2 mL/min (by C-G formula based on SCr of 1.21 mg/dL). Liver Function Tests: No results for input(s): AST, ALT, ALKPHOS, BILITOT, PROT, ALBUMIN in the last 168 hours. No results for input(s): LIPASE, AMYLASE in the last 168 hours. No results for input(s): AMMONIA in the last 168 hours. Coagulation Profile: Recent Labs  Lab 07/27/24 1545  INR 1.1   Cardiac Enzymes: No results for input(s): CKTOTAL, CKMB, CKMBINDEX, TROPONINI in the last 168 hours. BNP (last 3 results) No results for input(s): PROBNP in the last 8760 hours. HbA1C: No results for input(s): HGBA1C in the last 72 hours. CBG: No results for input(s): GLUCAP in the last 168 hours. Lipid Profile: No results for input(s): CHOL, HDL, LDLCALC, TRIG, CHOLHDL, LDLDIRECT in the last 72 hours. Thyroid Function Tests: No results for input(s): TSH, T4TOTAL, FREET4, T3FREE, THYROIDAB in the last 72 hours. Anemia Panel: No results for input(s): VITAMINB12, FOLATE, FERRITIN, TIBC, IRON, RETICCTPCT in the last 72 hours. Sepsis Labs: Recent Labs  Lab 07/27/24 1638 07/27/24 2236  LATICACIDVEN 1.5 1.2    Recent Results (from the past 240 hours)  Resp panel by RT-PCR (RSV, Flu A&B, Covid) Anterior Nasal Swab     Status: None   Collection Time: 07/24/24  4:54 PM   Specimen: Anterior Nasal Swab  Result Value Ref Range Status   SARS Coronavirus 2 by RT PCR NEGATIVE NEGATIVE Final    Comment: (NOTE) SARS-CoV-2 target nucleic acids are NOT DETECTED.  The  SARS-CoV-2 RNA is generally detectable in upper respiratory specimens during the acute phase of infection. The lowest concentration of SARS-CoV-2 viral copies this assay can detect is 138 copies/mL. A negative result does not preclude SARS-Cov-2 infection and should not be used as the sole basis for treatment or other patient management decisions. A negative result may occur with  improper specimen collection/handling, submission of specimen other than nasopharyngeal swab, presence of viral mutation(s) within the areas targeted by this assay, and inadequate number of viral copies(<138 copies/mL). A negative result must be combined with clinical observations, patient history, and epidemiological information. The expected result is Negative.  Fact Sheet for Patients:  bloggercourse.com  Fact Sheet for Healthcare Providers:  seriousbroker.it  This test is no t yet approved or cleared by the [place]  FDA and  has been authorized for detection and/or diagnosis of SARS-CoV-2 by FDA under an Emergency Use Authorization (EUA). This EUA will remain  in effect (meaning this test can be used) for the duration of the COVID-19 declaration under Section 564(b)(1) of the Act, 21 U.S.C.section 360bbb-3(b)(1), unless the authorization is terminated  or revoked sooner.       Influenza A by PCR  NEGATIVE NEGATIVE Final   Influenza B by PCR NEGATIVE NEGATIVE Final    Comment: (NOTE) The Xpert Xpress SARS-CoV-2/FLU/RSV plus assay is intended as an aid in the diagnosis of influenza from Nasopharyngeal swab specimens and should not be used as a sole basis for treatment. Nasal washings and aspirates are unacceptable for Xpert Xpress SARS-CoV-2/FLU/RSV testing.  Fact Sheet for Patients: bloggercourse.com  Fact Sheet for Healthcare Providers: seriousbroker.it  This test is not yet approved or  cleared by the [place]  FDA and has been authorized for detection and/or diagnosis of SARS-CoV-2 by FDA under an Emergency Use Authorization (EUA). This EUA will remain in effect (meaning this test can be used) for the duration of the COVID-19 declaration under Section 564(b)(1) of the Act, 21 U.S.C. section 360bbb-3(b)(1), unless the authorization is terminated or revoked.     Resp Syncytial Virus by PCR NEGATIVE NEGATIVE Final    Comment: (NOTE) Fact Sheet for Patients: bloggercourse.com  Fact Sheet for Healthcare Providers: seriousbroker.it  This test is not yet approved or cleared by the [place]  FDA and has been authorized for detection and/or diagnosis of SARS-CoV-2 by FDA under an Emergency Use Authorization (EUA). This EUA will remain in effect (meaning this test can be used) for the duration of the COVID-19 declaration under Section 564(b)(1) of the Act, 21 U.S.C. section 360bbb-3(b)(1), unless the authorization is terminated or revoked.  Performed at [facility], [address] [place], [place] [address]   Resp panel by RT-PCR (RSV, Flu A&B, Covid) Anterior Nasal Swab     Status: None   Collection Time: 07/27/24  4:38 PM   Specimen: Anterior Nasal Swab  Result Value Ref Range Status   SARS Coronavirus 2 by RT PCR NEGATIVE NEGATIVE Final    Comment: (NOTE) SARS-CoV-2 target nucleic acids are NOT DETECTED.  The SARS-CoV-2 RNA is generally detectable in upper respiratory specimens during the acute phase of infection. The lowest concentration of SARS-CoV-2 viral copies this assay can detect is 138 copies/mL. A negative result does not preclude SARS-Cov-2 infection and should not be used as the sole basis for treatment or other patient management decisions. A negative result may occur with  improper specimen collection/handling, submission of specimen other than nasopharyngeal swab, presence of  viral mutation(s) within the areas targeted by this assay, and inadequate number of viral copies(<138 copies/mL). A negative result must be combined with clinical observations, patient history, and epidemiological information. The expected result is Negative.  Fact Sheet for Patients:  bloggercourse.com  Fact Sheet for Healthcare Providers:  seriousbroker.it  This test is no t yet approved or cleared by the [place]  FDA and  has been authorized for detection and/or diagnosis of SARS-CoV-2 by FDA under an Emergency Use Authorization (EUA). This EUA will remain  in effect (meaning this test can be used) for the duration of the COVID-19 declaration under Section 564(b)(1) of the Act, 21 U.S.C.section 360bbb-3(b)(1), unless the authorization is terminated  or revoked sooner.       Influenza A by PCR NEGATIVE NEGATIVE Final   Influenza B by PCR NEGATIVE NEGATIVE Final    Comment: (NOTE) The Xpert Xpress SARS-CoV-2/FLU/RSV plus assay is intended as an aid in the diagnosis of influenza from Nasopharyngeal swab specimens and should not be used as a sole basis for treatment. Nasal washings and aspirates are unacceptable for Xpert Xpress SARS-CoV-2/FLU/RSV testing.  Fact Sheet for Patients: bloggercourse.com  Fact Sheet for Healthcare Providers: seriousbroker.it  This test is not yet approved or  cleared by the [place]  FDA and has been authorized for detection and/or diagnosis of SARS-CoV-2 by FDA under an Emergency Use Authorization (EUA). This EUA will remain in effect (meaning this test can be used) for the duration of the COVID-19 declaration under Section 564(b)(1) of the Act, 21 U.S.C. section 360bbb-3(b)(1), unless the authorization is terminated or revoked.     Resp Syncytial Virus by PCR NEGATIVE NEGATIVE Final    Comment: (NOTE) Fact Sheet for  Patients: bloggercourse.com  Fact Sheet for Healthcare Providers: seriousbroker.it  This test is not yet approved or cleared by the [place]  FDA and has been authorized for detection and/or diagnosis of SARS-CoV-2 by FDA under an Emergency Use Authorization (EUA). This EUA will remain in effect (meaning this test can be used) for the duration of the COVID-19 declaration under Section 564(b)(1) of the Act, 21 U.S.C. section 360bbb-3(b)(1), unless the authorization is terminated or revoked.  Performed at [facility], [address] [place], [place] [address]   Blood Culture (routine x 2)     Status: None (Preliminary result)   Collection Time: 07/27/24  4:38 PM   Specimen: BLOOD  Result Value Ref Range Status   Specimen Description BLOOD BLOOD LEFT ARM  Final   Special Requests   Final    BOTTLES DRAWN AEROBIC AND ANAEROBIC Blood Culture adequate volume   Culture   Final    NO GROWTH < 12 HOURS Performed at [facility], [address] [place], [place] [address]    Report Status PENDING  Incomplete  Blood Culture (routine x 2)     Status: None (Preliminary result)   Collection Time: 07/27/24  4:38 PM   Specimen: BLOOD  Result Value Ref Range Status   Specimen Description BLOOD LEFT ANTECUBITAL  Final   Special Requests   Final    BOTTLES DRAWN AEROBIC AND ANAEROBIC Blood Culture adequate volume   Culture   Final    NO GROWTH < 12 HOURS Performed at [facility], [address] [place], [place] [address]    Report Status PENDING  Incomplete         Radiology Studies: DG Chest 2 View Result Date: 07/27/2024 EXAM: 2 VIEW(S) XRAY OF THE CHEST 07/27/2024 03:58:24 PM COMPARISON: 07/24/2024 CLINICAL HISTORY: CP CP CP CP CP FINDINGS: LUNGS AND PLEURA: Patchy airspace disease in the bilateral midlungs, right greater than left. This has not significantly changed from prior. No pleural  effusion. No pneumothorax. HEART AND MEDIASTINUM: No acute abnormality of the cardiac and mediastinal silhouettes. BONES AND SOFT TISSUES: No acute osseous abnormality. IMPRESSION: 1. Stable airspace disease in the bilateral mid lungs, right greater than left, worrisome for multifocal pneumonia. Electronically signed by: Greig Pique MD 07/27/2024 04:02 PM EST RP Workstation: [id number]        Scheduled Meds:  azithromycin   500 mg Oral Daily   folic acid   1 mg Oral Daily   multivitamin with minerals  1 tablet Oral Daily   nicotine   21 mg Transdermal Daily   thiamine   100 mg Oral Daily   Or   thiamine   100 mg Intravenous Daily   Continuous Infusions:  cefTRIAXone  (ROCEPHIN )  IV     heparin  1,400 Units/hr (07/28/24 0549)     LOS: 0 days      Calvin KATHEE Robson, MD [facility]   If 7PM-7AM, please contact night-coverage  07/28/2024, 10:52 AM   "

## 2024-07-28 NOTE — Progress Notes (Signed)
 PHARMACY - ANTICOAGULATION CONSULT NOTE  Pharmacy Consult for Heparin  infusion Indication: pulmonary embolus  Allergies[1]  Patient Measurements: Height: 6' (182.9 cm) Weight: 104 kg (229 lb 4.5 oz) IBW/kg (Calculated) : 77.6 HEPARIN  DW (KG): 99.1  Vital Signs: Temp: 98.2 F (36.8 C) (01/22 0341) Temp Source: Oral (01/21 1950) BP: 114/84 (01/22 0341) Pulse Rate: 71 (01/22 0341)  Labs: Recent Labs    07/27/24 1545 07/28/24 0428  HGB 15.5 14.9  HCT 47.2 46.1  PLT 286 283  APTT  --  131*  LABPROT 15.3*  --   INR 1.1  --   HEPARINUNFRC  --  >1.10*  CREATININE 1.17 1.21    Estimated Creatinine Clearance: 100.2 mL/min (by C-G formula based on SCr of 1.21 mg/dL).   Medical History: History reviewed. No pertinent past medical history.  Medications:  Medications Prior to Admission  Medication Sig Dispense Refill Last Dose/Taking   APIXABAN  (ELIQUIS ) VTE STARTER PACK (10MG  AND 5MG ) Take as directed on package: start with two-5mg  tablets twice daily for 7 days. On day 8, switch to one-5mg  tablet twice daily. 74 each 0 07/27/2024 at 10:00 AM   levofloxacin  (LEVAQUIN ) 750 MG tablet Take 1 tablet (750 mg total) by mouth daily for 7 days. 7 tablet 0 07/26/2024   apixaban  (ELIQUIS ) 5 MG TABS tablet Take 1 tablet (5 mg total) by mouth 2 (two) times daily. 120 tablet 0     Assessment: 44 y/o M recently seen in the ED on 1/18 and discharged on apixaban  for PE now being evaluated in the ED for worsening chest pain and shortness of breath.   Last dose of apixaban  was this AM per patient (10 mg)  Goal of Therapy:  Heparin  level 0.3-0.7 units/ml aPTT 66-102 seconds Monitor platelets by anticoagulation protocol: Yes  01/22 0428 aPTT 131, supratherapeutic / HL >1.1   Plan:  Decrease heparin  infusion to 1400 units/hr Recheck aPTT 6 hours after rate change  Transition to anti-Xa levels when able Daily CBC/anti-Xa  Lauria Depoy S. Dede Dobesh, PharmD, [facility] 07/28/2024 5:42 AM          [1] No Known Allergies

## 2024-07-28 NOTE — Progress Notes (Signed)
 PHARMACY - ANTICOAGULATION CONSULT NOTE  Pharmacy Consult for Heparin  infusion Indication: pulmonary embolus  Allergies[1]  Patient Measurements: Height: 6' (182.9 cm) Weight: 104 kg (229 lb 4.5 oz) IBW/kg (Calculated) : 77.6 HEPARIN  DW (KG): 99.1  Vital Signs: Temp: 98 F (36.7 C) (01/22 0718) Temp Source: Oral (01/22 0718) BP: 119/84 (01/22 0718) Pulse Rate: 78 (01/22 0718)  Labs: Recent Labs    07/27/24 1545 07/28/24 0428 07/28/24 1247  HGB 15.5 14.9  --   HCT 47.2 46.1  --   PLT 286 283  --   APTT  --  131* 108*  LABPROT 15.3*  --   --   INR 1.1  --   --   HEPARINUNFRC  --  >1.10*  --   CREATININE 1.17 1.21  --     Estimated Creatinine Clearance: 100.2 mL/min (by C-G formula based on SCr of 1.21 mg/dL).   Medical History: History reviewed. No pertinent past medical history.  Medications:  Medications Prior to Admission  Medication Sig Dispense Refill Last Dose/Taking   APIXABAN  (ELIQUIS ) VTE STARTER PACK (10MG  AND 5MG ) Take as directed on package: start with two-5mg  tablets twice daily for 7 days. On day 8, switch to one-5mg  tablet twice daily. 74 each 0 07/27/2024 at 10:00 AM   levofloxacin  (LEVAQUIN ) 750 MG tablet Take 1 tablet (750 mg total) by mouth daily for 7 days. 7 tablet 0 07/26/2024   apixaban  (ELIQUIS ) 5 MG TABS tablet Take 1 tablet (5 mg total) by mouth 2 (two) times daily. 120 tablet 0     Assessment: 44 y/o M recently seen in the ED on 1/18 and discharged on apixaban  for PE now being evaluated in the ED for worsening chest pain and shortness of breath.   Last dose of apixaban  was this AM per patient (10 mg)  Goal of Therapy:  Heparin  level 0.3-0.7 units/ml aPTT 66-102 seconds Monitor platelets by anticoagulation protocol: Yes  01/22 0428 aPTT 131, supratherapeutic / HL >1.1 01/22 1247 aPTT 108, supratherapeutic    Plan:  Decrease heparin  infusion to 1300 units/hr Recheck aPTT 6 hours after rate change  Transition to anti-Xa levels when  correlating with aPTT Daily CBC/anti-Xa  Fulton Merry PharmD Clinical Pharmacist 07/28/2024          [1] No Known Allergies

## 2024-07-28 NOTE — TOC CM/SW Note (Signed)
 Transition of Care [facility]) - Inpatient Brief Assessment   Patient Details  Name: Craig Watkins MRN: [id number] Date of Birth: 08-Feb-1981  Transition of Care [facility]) CM/SW Contact:    Corean ONEIDA Haddock, RN Phone Number: 07/28/2024, 9:04 AM   Clinical Narrative:   Transition of Care Department [facility]) has reviewed patient and no TOC needs have been identified at this time.  If new patient transition needs arise, please place a TOC consult.  List of local PCP added to AVS CM consult for Substance Abuse, resources added to AVS.  CM does not provide counseling or education  Transition of Care Asessment: Insurance and Status: Insurance coverage has been reviewed Patient has primary care physician: No (List of local PCP added to AVS)     Prior/Current Home Services: No current home services Social Drivers of Health Review: SDOH reviewed no interventions necessary Readmission risk has been reviewed: Yes Transition of care needs: no transition of care needs at this time

## 2024-07-28 NOTE — Consult Note (Signed)
 "  NAME:  Craig Watkins, MRN:  [id number], DOB:  05-22-1981, LOS: 0 ADMISSION DATE:  07/27/2024, CONSULTATION DATE: 07/28/2024 REFERRING MD: Calvin Robson, MD CHIEF COMPLAINT: Bilateral pulmonary opacities  History of Present Illness:  Patient is a 44 year old current smoker who presented yesterday to [facility] with complaints of chest pain.  Recently diagnosed with pulmonary embolism.  He notes left-sided chest pain and shortness of breath.  He initially presented on 18 January with similar complaints and was diagnosed with multifocal pneumonia as well as small subsegmental pulmonary emboli.  Admission was recommended but the patient declined.  Patient was diagnosed at the time on levofloxacin  and Eliquis .  The patient does not endorse any shortness of breath no wheezing.  No cough or sputum production.  No hemoptysis.  He has had no weight loss or anorexia.  If anything he has had weight gain of over 20 pounds in the last 6 months.  He states that he used to be more active at work however his current work is a more sedentary office manager.  I have reviewed his CT angio chest from 24 July 2024 independently.  There are subsegmental pulmonary emboli in the right upper lobe with no other pulmonary embolism identified.  There is multifocal slightly nodular masslike airspace consolidation throughout both lungs that may be related to pneumonia (organizing or acute), other considerations in the differential are sarcoidosis, malignancy not completely excluded.  There are also scattered ground glass opacities and emphysema noted.  These findings were relayed to the patient.  He does not endorse any other symptomatology.  As noted he is a smoker smoking approximately a pack a day.  Also advised alcohol (states abstinent since 1/18)  Does not endorse any family history of sarcoidosis or other lung disease.  I have reviewed the available imaging.  Of note a plain chest x-ray from 2020 shows some early nodular type  infiltrates.  Pertinent  Medical History  Recent pulmonary embolus Bilateral pulmonary masslike infiltrates Ethanol abuse, in early remission Tobacco abuse   Significant Hospital Events: Including procedures, antibiotic start and stop dates in addition to other pertinent events   07/27/2024 admitted to [facility] 07/28/2024 PCCM consult  Interim History / Subjective:  Patient notes pain has improved.  Does not endorse any shortness of breath.  Objective    Blood pressure 119/84, pulse 78, temperature 98 F (36.7 C), temperature source Oral, resp. rate 19, height 6' (1.829 m), weight 104 kg, SpO2 99%.        Intake/Output Summary (Last 24 hours) at 07/28/2024 1332 Last data filed at 07/28/2024 1008 Gross per 24 hour  Intake 440 ml  Output --  Net 440 ml   Filed Weights   07/27/24 1543  Weight: 104 kg    Examination: GENERAL: Obese gentleman, no acute distress, laying comfortably in bed.  No conversational dyspnea. HEAD: Normocephalic, atraumatic.  EYES: Pupils equal, round, reactive to light.  No scleral icterus.  MOUTH: Dentition intact, oral mucosa moist.  No thrush. NECK: Supple. No thyromegaly. Trachea midline. No JVD.  No adenopathy. PULMONARY: Good air entry bilaterally.  Coarse, otherwise, no adventitious sounds. CARDIOVASCULAR: S1 and S2. Regular rate and rhythm.  No rubs, murmurs or gallops heard. ABDOMEN: Obese, otherwise benign. MUSCULOSKELETAL: No joint deformity, no clubbing, no edema.  NEUROLOGIC: No overt focal deficit. SKIN: Intact,warm,dry.  On limited exam no rashes noted. PSYCH: Mood and behavior normal.   Imaging   Representative image from CT performed 07/24/2024 showing bilateral masslike densities:  Assessment and Plan   Bilateral pulmonary masslike infiltrates DDx: Pneumonia (acute versus organizing), sarcoidosis, malignancy not excluded I agree with sed rate, CRP and ACE level determinations Continue antibiotics for now Favor  sarcoidosis given nodular infiltrates noted on chest x-ray of 2020 Continue workup as an outpatient  Subsegmental pulmonary embolism right upper lobe Patient endorses weight gain and sedentary lifestyle over the last 6 months Continue anticoagulation for a total of 6 months. Thrombophilia workup  Tobacco dependence due to cigarettes Counseled regards to smoking cessation  Alcohol abuse Abstinent since 01/18 Monitor for potential withdrawal   Labs   CBC: Recent Labs  Lab 07/24/24 1608 07/27/24 1545 07/28/24 0428  WBC 7.5 7.9 9.2  HGB 15.2 15.5 14.9  HCT 45.6 47.2 46.1  MCV 93.4 92.9 94.5  PLT 265 286 283    Basic Metabolic Panel: Recent Labs  Lab 07/24/24 1608 07/27/24 1545 07/28/24 0428  NA 137 137 136  K 4.1 4.0 4.5  CL 104 101 101  CO2 24 25 26   GLUCOSE 122* 121* 94  BUN 14 16 14   CREATININE 1.15 1.17 1.21  CALCIUM 9.1 9.1 9.1   GFR: Estimated Creatinine Clearance: 100.2 mL/min (by C-G formula based on SCr of 1.21 mg/dL). Recent Labs  Lab 07/24/24 1608 07/27/24 1545 07/27/24 1638 07/27/24 2236 07/28/24 0428 07/28/24 1247  PROCALCITON  --   --   --   --   --  <0.10  WBC 7.5 7.9  --   --  9.2  --   LATICACIDVEN  --   --  1.5 1.2  --   --     Liver Function Tests: No results for input(s): AST, ALT, ALKPHOS, BILITOT, PROT, ALBUMIN in the last 168 hours. No results for input(s): LIPASE, AMYLASE in the last 168 hours. No results for input(s): AMMONIA in the last 168 hours.  ABG No results found for: PHART, PCO2ART, PO2ART, HCO3, TCO2, ACIDBASEDEF, O2SAT   Coagulation Profile: Recent Labs  Lab 07/27/24 1545  INR 1.1    Cardiac Enzymes: No results for input(s): CKTOTAL, CKMB, CKMBINDEX, TROPONINI in the last 168 hours.  HbA1C: No results found for: HGBA1C  CBG: No results for input(s): GLUCAP in the last 168 hours.  Review of Systems:   A 10 point review of systems was performed and it is as  noted above otherwise negative.  Past Medical History:  He,  has no past medical history on file.   Surgical History:  History reviewed. No pertinent surgical history.   Social History:   reports that he has been smoking cigarettes. He has never used smokeless tobacco. He reports that he does not currently use alcohol. He reports that he does not use drugs.   Family History:  His family history is not on file.   Allergies Allergies[1]   Home Medications  Prior to Admission medications  Medication Sig Start Date End Date Taking? Authorizing Provider  APIXABAN  (ELIQUIS ) VTE STARTER PACK (10MG  AND 5MG ) Take as directed on package: start with two-5mg  tablets twice daily for 7 days. On day 8, switch to one-5mg  tablet twice daily. 07/24/24  Yes Menshew, Candida LULLA Kings, PA-C  levofloxacin  (LEVAQUIN ) 750 MG tablet Take 1 tablet (750 mg total) by mouth daily for 7 days. 07/24/24 07/31/24 Yes Menshew, Candida LULLA Kings, PA-C  apixaban  (ELIQUIS ) 5 MG TABS tablet Take 1 tablet (5 mg total) by mouth 2 (two) times daily. 07/24/24 09/22/24  Menshew, Candida LULLA Kings, PA-C   Scheduled Meds:  azithromycin   500 mg Oral Daily   folic acid   1 mg Oral Daily   multivitamin with minerals  1 tablet Oral Daily   nicotine   21 mg Transdermal Daily   thiamine   100 mg Oral Daily   Or   thiamine   100 mg Intravenous Daily   Continuous Infusions:  cefTRIAXone  (ROCEPHIN )  IV     heparin  1,400 Units/hr (07/28/24 0549)   PRN Meds:.acetaminophen  **OR** acetaminophen , albuterol , HYDROmorphone  (DILAUDID ) injection, labetalol , LORazepam  **OR** LORazepam , ondansetron  **OR** ondansetron  (ZOFRAN ) IV, oxyCODONE     99254    Discussed in detail with Dr. Jhonny.  Will see the patient in follow-up with further workup as outpatient.    I spent 60 minutes of dedicated to the care of this patient on the date of this encounter to include pre-visit review of records, face-to-face time with the patient discussing conditions above,  post visit ordering of testing, clinical documentation with the electronic health record, making appropriate referrals as documented, and communicating necessary findings to members of the patients care team.   C. Leita Sanders, MD Advanced Bronchoscopy PCCM [facility]    *This note was generated using voice recognition software/Dragon and/or AI transcription program.  Despite best efforts to proofread, errors can occur which can change the meaning. Any transcriptional errors that result from this process are unintentional and may not be fully corrected at the time of dictation.       [1] No Known Allergies  "

## 2024-07-28 NOTE — Plan of Care (Signed)

## 2024-07-28 NOTE — Discharge Instructions (Signed)
Addiction and Co-Occurring Disorders   The [facility], Intensive Outpatient Program is held three afternoons from 1 to 4pm; Monday, Wednesday and Friday allowing patients the opportunity to participate in a treatment program in the day while attending A meetings in the evening. W e provide educational lectures and films, group therapy, individual counseling and family counseling.  A multidisciplinary team of the [facility] healthcare professionals, headed by the Medical Director, designs an individualized treatment program to suit the needs of each patient. The length of stay in this program is determined by the goals and objectives set by the treatment team, but usually is 6 to 10 weeks. Direct admission to IOP is possible after an individual evaluation by our assessment team. Chemical usage patterns, the progressionof disease in the individual, the possible need for detoxification and the availability of an external support system are factors in determining who is appropriate for. direct OP admission. Each person has the opportunity to embrace a lifestyle of on-going recovery, family involvement and attending a 12 Step Support group. Program Objectives Include  Chemicaldependency education Abstinence for all mind-altering chemicals 12 Step support Relapse Prevention Planning Improved communication skills Spirituality and personal fulfilment Mondav-Wednesday-Friday 1:00pm to 4:00pm Individual and Family Sessions are set by the Counselor [place], [place], [place] Licensed Clinical Addictions Spcialist [phone]    [facility] Chemical Depencency Intensive OutpatientPrograms Alcohol & Drug Services (ADS) [address], [place] [address] [place]: [phone] [place]: [place]: [place]: [phone] Both daytime & evening counseling available Accepts Medicaid and other insurances  [facility] four  times a week for 2 1?2 -3 hours each session Both daytime & evening counseling available Accepts Medicaid and other insurances  [facility] Health CD-IOP - Outpatient Services [address] [place] [address] [phone] Meets 3 days a week for 4 hours Accepts Medicare and other insurances (not Medicaid)  [place].: Substance [place] [address] [place] [address] [phone]  These referrals have been provided to you as appropriate for your clinical needs while taking into account your financial concerns. Please be aware that agencies, practitioners and insurance companies sometimes change contracts. When calling to make an appointment have your insurance information available so the professional you aregoing to see can confirm whether they are covered by your plan. Take this form with you in case the person you are seeing needs a copy or to contact us. Assessment Counselor   [facility] Intensive Outpatient Program [place], [place] [address] [phone] Private insurance, Medicare A&B, and [facility] ADS (Alcohol and Drug Services) [address] [place], [place], [place] [address] [phone] Medicaid, Self Pay  [place] [address] [place], Self Pay  [facility] IOP and Partial Hospitalization Program [place]. [place], [place] [address] [phone] Private Insurance, [place] only for partial [facility] [place]. [place], [place] Private Insurance and Self Pay  [facility] '[address] [place] [address] [place], [place], and Self Pay.  Medical laboratory scientific officer (Partial Hospitalization) [place]  [address] [place] [address] Private Insurance, [place], and Self Pay  [place] [address] [place], [place], [place] [address] [phone] [place]  [place], [place] [address] Phone: [phone] or [phone] [place], [place] [address] [phone] Medicaid, private insurance [facility] [address] [place], [place] [address] [phone] or [phone] Self Pay only, sliding scale  [facility] [address] [place], [place] [address] [phone] State funds for uninsured [place]. [place], [place] Private Insurance, and Self pay [employer], some Medicaid Crisis Mobile: [facility]: [phone] (for crisis response 24 hours a day) [place] [phone]   [place]. [place], [place], and [place] Programs [address] [place], [place], and [place]. [facility]: [phone] ASAP: Adolescent Substance Abuse Program Males ages: 12-17 ASAP: Adolescent Substance abuse Program Females: 12-17 [facility] [employer] Structured Day Program [phone] CrisisMobile: [facility]: [phone] (for crisis response 24 hours a day) [facility] [phone]

## 2024-07-28 NOTE — Progress Notes (Signed)
 PHARMACY - ANTICOAGULATION CONSULT NOTE  Pharmacy Consult for Heparin  Infusion Indication: pulmonary embolus  Allergies[1]  Patient Measurements: Height: 6' (182.9 cm) Weight: 104 kg (229 lb 4.5 oz) IBW/kg (Calculated) : 77.6 HEPARIN  DW (KG): 99.1  Labs: Recent Labs    07/27/24 1545 07/28/24 0428 07/28/24 1247 07/28/24 2031  HGB 15.5 14.9  --   --   HCT 47.2 46.1  --   --   PLT 286 283  --   --   APTT  --  131* 108* 99*  LABPROT 15.3*  --   --   --   INR 1.1  --   --   --   HEPARINUNFRC  --  >1.10*  --   --   CREATININE 1.17 1.21  --   --     Estimated Creatinine Clearance: 100.2 mL/min (by C-G formula based on SCr of 1.21 mg/dL).   Medical History: History reviewed. No pertinent past medical history.  Medications:  Medications Prior to Admission  Medication Sig Dispense Refill Last Dose/Taking   APIXABAN  (ELIQUIS ) VTE STARTER PACK (10MG  AND 5MG ) Take as directed on package: start with two-5mg  tablets twice daily for 7 days. On day 8, switch to one-5mg  tablet twice daily. 74 each 0 07/27/2024 at 10:00 AM   levofloxacin  (LEVAQUIN ) 750 MG tablet Take 1 tablet (750 mg total) by mouth daily for 7 days. 7 tablet 0 07/26/2024   apixaban  (ELIQUIS ) 5 MG TABS tablet Take 1 tablet (5 mg total) by mouth 2 (two) times daily. 120 tablet 0     Assessment: 44 y/o M recently seen in the ED on 1/18 and discharged on apixaban  for PE now being evaluated in the ED for worsening chest pain and shortness of breath.   Last dose of apixaban  was this AM per patient (10 mg)  Goal of Therapy:  Heparin  level 0.3-0.7 units/ml aPTT 66-102 seconds Monitor platelets by anticoagulation protocol: Yes  0122 0428 aPTT 131, supratherapeutic / HL >1.1 0122 1247 aPTT 108, supratherapeutic 0122  2031 aPTT 99, therapeutic x 1   Plan:  Continue heparin  infusion at 1300 units/hr Recheck aPTT in 6 hours Transition to anti-Xa levels when correlating with aPTT Daily CBC/anti-Xa  Craig Watkins 07/28/2024    [1] No Known Allergies

## 2024-07-29 ENCOUNTER — Other Ambulatory Visit: Payer: Self-pay

## 2024-07-29 ENCOUNTER — Observation Stay
Admission: RE | Admit: 2024-07-29 | Discharge: 2024-07-29 | Disposition: A | Source: Ambulatory Visit | Attending: Internal Medicine

## 2024-07-29 DIAGNOSIS — R079 Chest pain, unspecified: Secondary | ICD-10-CM

## 2024-07-29 DIAGNOSIS — R0602 Shortness of breath: Secondary | ICD-10-CM | POA: Diagnosis not present

## 2024-07-29 DIAGNOSIS — I2699 Other pulmonary embolism without acute cor pulmonale: Secondary | ICD-10-CM

## 2024-07-29 DIAGNOSIS — J189 Pneumonia, unspecified organism: Secondary | ICD-10-CM | POA: Diagnosis not present

## 2024-07-29 DIAGNOSIS — A419 Sepsis, unspecified organism: Secondary | ICD-10-CM | POA: Diagnosis not present

## 2024-07-29 LAB — ECHOCARDIOGRAM COMPLETE
Area-P 1/2: 3.77 cm2
Height: 72 in
S' Lateral: 3.1 cm
Single Plane A4C EF: 56.9 %
Weight: 3668.45 [oz_av]

## 2024-07-29 LAB — ANGIOTENSIN CONVERTING ENZYME: Angiotensin-Converting Enzyme: 84 U/L — ABNORMAL HIGH (ref 14–82)

## 2024-07-29 LAB — CBC
HCT: 45.9 % (ref 39.0–52.0)
Hemoglobin: 14.7 g/dL (ref 13.0–17.0)
MCH: 30.2 pg (ref 26.0–34.0)
MCHC: 32 g/dL (ref 30.0–36.0)
MCV: 94.3 fL (ref 80.0–100.0)
Platelets: 281 K/uL (ref 150–400)
RBC: 4.87 MIL/uL (ref 4.22–5.81)
RDW: 11.8 % (ref 11.5–15.5)
WBC: 6.9 K/uL (ref 4.0–10.5)
nRBC: 0 % (ref 0.0–0.2)

## 2024-07-29 LAB — APTT: aPTT: 99 s — ABNORMAL HIGH (ref 24–36)

## 2024-07-29 LAB — LEGIONELLA PNEUMOPHILA SEROGP 1 UR AG: L. pneumophila Serogp 1 Ur Ag: NEGATIVE

## 2024-07-29 LAB — HEPARIN LEVEL (UNFRACTIONATED): Heparin Unfractionated: 0.73 [IU]/mL — ABNORMAL HIGH (ref 0.30–0.70)

## 2024-07-29 MED ORDER — LEVOFLOXACIN 750 MG PO TABS
750.0000 mg | ORAL_TABLET | Freq: Every day | ORAL | 0 refills | Status: AC
  Filled 2024-07-29: qty 7, 7d supply, fill #0

## 2024-07-29 MED ORDER — OXYCODONE HCL 10 MG PO TABS
5.0000 mg | ORAL_TABLET | Freq: Three times a day (TID) | ORAL | 0 refills | Status: DC | PRN
  Filled 2024-07-29: qty 10, 7d supply, fill #0

## 2024-07-29 MED ORDER — APIXABAN 5 MG PO TABS
ORAL_TABLET | ORAL | 0 refills | Status: DC
  Filled 2024-07-29: qty 74, 30d supply, fill #0

## 2024-07-29 MED ORDER — APIXABAN 5 MG PO TABS
10.0000 mg | ORAL_TABLET | Freq: Two times a day (BID) | ORAL | Status: DC
  Administered 2024-07-29: 10 mg via ORAL
  Filled 2024-07-29: qty 2

## 2024-07-29 NOTE — Plan of Care (Signed)

## 2024-07-29 NOTE — Progress Notes (Signed)
" °  Echocardiogram 2D Echocardiogram has been performed.  Craig Watkins 07/29/2024, 8:45 AM "

## 2024-07-29 NOTE — Discharge Summary (Signed)
 Physician Discharge Summary  Craig Watkins FMW:[id number] DOB: [birth year] DOA: 07/27/2024  PCP: Pcp, No  Admit date: 07/27/2024 Discharge date: 07/29/2024  Admitted From: Home Disposition:  Home  Recommendations for Outpatient Follow-up:  Follow up with PCP in 1-2 weeks Follow-up pulmonary 2/3  Home Health: No Equipment/Devices: None  Discharge Condition: Stable CODE STATUS: Full Diet recommendation: Regular  Brief/Interim Summary:  44 y.o. male with medical history significant of alcohol use disorder, tobacco use, recently diagnosed pulmonary embolism presents to the emergency room with chief complaint of left-sided chest pain and shortness of breath.  Patient was in the emergency room on 1/18 with similar complaints.  At that time he was diagnosed with a multifocal pneumonia as well as small submitted segmental pulmonary emboli.  Admission was recommended at that time however patient declined citing childcare responsibilities outside of the hospital.   He was discharged on levofloxacin  and Eliquis  for which he endorses compliance however his symptoms have persisted.  Patient presented back to the emergency room on 1/21 with similar complaints.      Discharge Diagnoses:  Principal Problem:   Sepsis due to pneumonia [facility]) Active Problems:   Bilateral pulmonary infiltrates on chest x-ray   Pulmonary embolism of right upper lobe (HCC)  Sepsis secondary to pneumonia Multifocal pneumonia, abnormal chest CT Sepsis criteria met with tachycardia and tachypnea.  No endorgan damage or elevated lactic acid to suggest severe sepsis.  Of note the infiltrates on chest imaging are unable to exclude malignancy as a notable factor.  Patient still endorsing left-sided chest pain and shortness of breath.  Not requiring oxygen at this time.  There is some concern for infiltrative pulmonary process such as pulmonary sarcoid.  ESR and CRP are mildly elevated.  ACE level at 84 with upper limit of  normal being 82.  Results at this time are nondiagnostic. Plan: IV antibiotics discontinued.  Start p.o. Levaquin .  Complete additional 7-day course.  Patient will be followed by pulmonary as outpatient.  Case discussed at length with Dr. Tamea.   Pulmonary embolism Subsegmental.  Diagnosed on CAT scan 3 days prior to this presentation Still having chest pain intermittent.  No evidence of cardiac damage. Plan: Restart apixaban .  Will need to reload 10 mg twice daily x 7 days followed by 5 mg twice daily   Alcohol use disorder Patient endorses use of 6-7 shots daily Has not had any alcohol since 1/18 No overt signs of withdrawal.  Tachycardia improved Plan: Advised on cessation   Tobacco use Counseled on cessation   Sinus tachycardia Could be secondary to pulmonary embolism versus infectious process.  Also could be related to mild withdrawals.  Troponins negative.  Tachycardia improving.  Normal sinus rhythm at time of discharge   Discharge Instructions  Discharge Instructions     Increase activity slowly   Complete by: As directed       Allergies as of 07/29/2024   No Known Allergies      Medication List     TAKE these medications    Eliquis  5 MG Tabs tablet Generic drug: apixaban  Take 2 tablets (10 mg total) by mouth 2 (two) times daily for 7 days, THEN 1 tablet (5 mg total) 2 (two) times daily. Start taking on: July 29, 2024 What changed:  See the new instructions. Another medication with the same name was removed. Continue taking this medication, and follow the directions you see here.   levofloxacin  750 MG tablet Commonly known as: Levaquin  Take 1 tablet (  750 mg total) by mouth daily for 7 days.   Oxycodone  HCl 10 MG Tabs Take 0.5 tablets (5 mg total) by mouth every 8 (eight) hours as needed.        Allergies[1]  Consultations: Pulmonary   Procedures/Studies: DG Chest 2 View Result Date: 07/27/2024 EXAM: 2 VIEW(S) XRAY OF THE CHEST  07/27/2024 03:58:24 PM COMPARISON: 07/24/2024 CLINICAL HISTORY: CP CP CP CP CP FINDINGS: LUNGS AND PLEURA: Patchy airspace disease in the bilateral midlungs, right greater than left. This has not significantly changed from prior. No pleural effusion. No pneumothorax. HEART AND MEDIASTINUM: No acute abnormality of the cardiac and mediastinal silhouettes. BONES AND SOFT TISSUES: No acute osseous abnormality. IMPRESSION: 1. Stable airspace disease in the bilateral mid lungs, right greater than left, worrisome for multifocal pneumonia. Electronically signed by: Greig Pique MD 07/27/2024 04:02 PM EST RP Workstation: [id number]   CT Angio Chest PE W and/or Wo Contrast Result Date: 07/24/2024 EXAM: CTA CHEST 07/24/2024 05:44:47 PM TECHNIQUE: CTA of the chest was performed after the administration of 75 mL of iohexol  (OMNIPAQUE ) 350 MG/ML injection. Multiplanar reformatted images are provided for review. MIP images are provided for review. Automated exposure control, iterative reconstruction, and/or weight based adjustment of the mA/kV was utilized to reduce the radiation dose to as low as reasonably achievable. COMPARISON: None available. CLINICAL HISTORY: Pulmonary embolism (PE) suspected, high prob. FINDINGS: PULMONARY ARTERIES: Pulmonary arteries are adequately opacified for evaluation. There are subsegmental pulmonary emboli in the right upper lobe in the region of masslike opacity. No other pulmonary embolism identified. Main pulmonary artery is normal in caliber. MEDIASTINUM: The heart and pericardium demonstrate no acute abnormality. There is no acute abnormality of the thoracic aorta. LYMPH NODES: Right paratracheal lymphadenopathy measures up to 13 mm. Subcarinal lymphadenopathy measures up to 14 mm. Right hilar lymphadenopathy measures up to 2.1 cm. Left hilar lymphadenopathy measures up to 17 mm. Prevascular lymphadenopathy measures up to 10 mm. LUNGS AND PLEURA: Multifocal slightly nodular/masslike areas of  airspace consolidation are seen throughout both lungs. The largest area is in the right upper lobe measuring 6.1 x 3.2 cm abutting the hilum. Second largest area is seen in the left upper lobe measuring 3.9 x 1.4 cm. There are bullae in the right lung apex. No pulmonary edema. No evidence of pleural effusion or pneumothorax. UPPER ABDOMEN: Limited images of the upper abdomen are unremarkable. SOFT TISSUES AND BONES: No acute bone or soft tissue abnormality. IMPRESSION: 1. Subsegmental pulmonary emboli in the right upper lobe in the region of masslike opacity, with no other pulmonary embolism identified. 2. Multifocal slightly nodular/masslike airspace consolidation throughout both lungs, largest in the right upper lobe measuring 6.1 x 3.2 cm and second largest in the left upper lobe measuring 3.9 x 1.4 cm. CT may be related to multifocal infection .neoplastic process cannot be excluded. Please correlate clinically. Short term follow-up CT recommended. 3. Mediastinal and bilateral hilar lymphadenopathy. Electronically signed by: Greig Pique MD 07/24/2024 06:12 PM EST RP Workstation: [id number]   DG Chest 2 View Result Date: 07/24/2024 CLINICAL DATA:  Chest pain radiating to left shoulder. EXAM: CHEST - 2 VIEW COMPARISON:  07/20/2018. FINDINGS: The heart size and mediastinal contours are within normal limits. Scattered airspace disease is present in the lungs bilaterally. No effusion or pneumothorax is seen. No acute osseous abnormality. IMPRESSION: Multifocal airspace disease bilaterally, suspicious for pneumonia. Electronically Signed   By: Leita Birmingham M.D.   On: 07/24/2024 17:17      Subjective: Seen and examined  on the day of discharge.  Stable no distress.  Appropriate for discharge home.  Discharge Exam: Vitals:   07/29/24 0355 07/29/24 0731  BP: 122/70 131/84  Pulse: 80 84  Resp:  17  Temp: 98 F (36.7 C) 98.2 F (36.8 C)  SpO2: 99% 97%   Vitals:   07/28/24 1946 07/29/24 0353 07/29/24  0355 07/29/24 0731  BP: 120/79 122/70 122/70 131/84  Pulse: 82 80 80 84  Resp: 18   17  Temp: 98.4 F (36.9 C)  98 F (36.7 C) 98.2 F (36.8 C)  TempSrc:   Oral   SpO2: 98% 99% 99% 97%  Weight:      Height:        General: Pt is alert, awake, not in acute distress Cardiovascular: RRR, S1/S2 +, no rubs, no gallops Respiratory: CTA bilaterally, no wheezing, no rhonchi Abdominal: Soft, NT, ND, bowel sounds + Extremities: no edema, no cyanosis    The results of significant diagnostics from this hospitalization (including imaging, microbiology, ancillary and laboratory) are listed below for reference.     Microbiology: Recent Results (from the past 240 hours)  Resp panel by RT-PCR (RSV, Flu A&B, Covid) Anterior Nasal Swab     Status: None   Collection Time: 07/24/24  4:54 PM   Specimen: Anterior Nasal Swab  Result Value Ref Range Status   SARS Coronavirus 2 by RT PCR NEGATIVE NEGATIVE Final    Comment: (NOTE) SARS-CoV-2 target nucleic acids are NOT DETECTED.  The SARS-CoV-2 RNA is generally detectable in upper respiratory specimens during the acute phase of infection. The lowest concentration of SARS-CoV-2 viral copies this assay can detect is 138 copies/mL. A negative result does not preclude SARS-Cov-2 infection and should not be used as the sole basis for treatment or other patient management decisions. A negative result may occur with  improper specimen collection/handling, submission of specimen other than nasopharyngeal swab, presence of viral mutation(s) within the areas targeted by this assay, and inadequate number of viral copies(<138 copies/mL). A negative result must be combined with clinical observations, patient history, and epidemiological information. The expected result is Negative.  Fact Sheet for Patients:  bloggercourse.com  Fact Sheet for Healthcare Providers:  seriousbroker.it  This test is no t yet  approved or cleared by the [place]  FDA and  has been authorized for detection and/or diagnosis of SARS-CoV-2 by FDA under an Emergency Use Authorization (EUA). This EUA will remain  in effect (meaning this test can be used) for the duration of the COVID-19 declaration under Section 564(b)(1) of the Act, 21 U.S.C.section 360bbb-3(b)(1), unless the authorization is terminated  or revoked sooner.       Influenza A by PCR NEGATIVE NEGATIVE Final   Influenza B by PCR NEGATIVE NEGATIVE Final    Comment: (NOTE) The Xpert Xpress SARS-CoV-2/FLU/RSV plus assay is intended as an aid in the diagnosis of influenza from Nasopharyngeal swab specimens and should not be used as a sole basis for treatment. Nasal washings and aspirates are unacceptable for Xpert Xpress SARS-CoV-2/FLU/RSV testing.  Fact Sheet for Patients: bloggercourse.com  Fact Sheet for Healthcare Providers: seriousbroker.it  This test is not yet approved or cleared by the [place]  FDA and has been authorized for detection and/or diagnosis of SARS-CoV-2 by FDA under an Emergency Use Authorization (EUA). This EUA will remain in effect (meaning this test can be used) for the duration of the COVID-19 declaration under Section 564(b)(1) of the Act, 21 U.S.C. section 360bbb-3(b)(1), unless the authorization is terminated  or revoked.     Resp Syncytial Virus by PCR NEGATIVE NEGATIVE Final    Comment: (NOTE) Fact Sheet for Patients: bloggercourse.com  Fact Sheet for Healthcare Providers: seriousbroker.it  This test is not yet approved or cleared by the [place]  FDA and has been authorized for detection and/or diagnosis of SARS-CoV-2 by FDA under an Emergency Use Authorization (EUA). This EUA will remain in effect (meaning this test can be used) for the duration of the COVID-19 declaration under Section 564(b)(1) of  the Act, 21 U.S.C. section 360bbb-3(b)(1), unless the authorization is terminated or revoked.  Performed at [facility] [address] [place], [place] [address]   Resp panel by RT-PCR (RSV, Flu A&B, Covid) Anterior Nasal Swab     Status: None   Collection Time: 07/27/24  4:38 PM   Specimen: Anterior Nasal Swab  Result Value Ref Range Status   SARS Coronavirus 2 by RT PCR NEGATIVE NEGATIVE Final    Comment: (NOTE) SARS-CoV-2 target nucleic acids are NOT DETECTED.  The SARS-CoV-2 RNA is generally detectable in upper respiratory specimens during the acute phase of infection. The lowest concentration of SARS-CoV-2 viral copies this assay can detect is 138 copies/mL. A negative result does not preclude SARS-Cov-2 infection and should not be used as the sole basis for treatment or other patient management decisions. A negative result may occur with  improper specimen collection/handling, submission of specimen other than nasopharyngeal swab, presence of viral mutation(s) within the areas targeted by this assay, and inadequate number of viral copies(<138 copies/mL). A negative result must be combined with clinical observations, patient history, and epidemiological information. The expected result is Negative.  Fact Sheet for Patients:  bloggercourse.com  Fact Sheet for Healthcare Providers:  seriousbroker.it  This test is no t yet approved or cleared by the [place]  FDA and  has been authorized for detection and/or diagnosis of SARS-CoV-2 by FDA under an Emergency Use Authorization (EUA). This EUA will remain  in effect (meaning this test can be used) for the duration of the COVID-19 declaration under Section 564(b)(1) of the Act, 21 U.S.C.section 360bbb-3(b)(1), unless the authorization is terminated  or revoked sooner.       Influenza A by PCR NEGATIVE NEGATIVE Final   Influenza B by PCR NEGATIVE NEGATIVE  Final    Comment: (NOTE) The Xpert Xpress SARS-CoV-2/FLU/RSV plus assay is intended as an aid in the diagnosis of influenza from Nasopharyngeal swab specimens and should not be used as a sole basis for treatment. Nasal washings and aspirates are unacceptable for Xpert Xpress SARS-CoV-2/FLU/RSV testing.  Fact Sheet for Patients: bloggercourse.com  Fact Sheet for Healthcare Providers: seriousbroker.it  This test is not yet approved or cleared by the [place]  FDA and has been authorized for detection and/or diagnosis of SARS-CoV-2 by FDA under an Emergency Use Authorization (EUA). This EUA will remain in effect (meaning this test can be used) for the duration of the COVID-19 declaration under Section 564(b)(1) of the Act, 21 U.S.C. section 360bbb-3(b)(1), unless the authorization is terminated or revoked.     Resp Syncytial Virus by PCR NEGATIVE NEGATIVE Final    Comment: (NOTE) Fact Sheet for Patients: bloggercourse.com  Fact Sheet for Healthcare Providers: seriousbroker.it  This test is not yet approved or cleared by the [place]  FDA and has been authorized for detection and/or diagnosis of SARS-CoV-2 by FDA under an Emergency Use Authorization (EUA). This EUA will remain in effect (meaning this test can be used) for the duration of the  COVID-19 declaration under Section 564(b)(1) of the Act, 21 U.S.C. section 360bbb-3(b)(1), unless the authorization is terminated or revoked.  Performed at [facility], [address] [place], [place] [address]   Blood Culture (routine x 2)     Status: None (Preliminary result)   Collection Time: 07/27/24  4:38 PM   Specimen: BLOOD  Result Value Ref Range Status   Specimen Description BLOOD BLOOD LEFT ARM  Final   Special Requests   Final    BOTTLES DRAWN AEROBIC AND ANAEROBIC Blood Culture adequate volume   Culture    Final    NO GROWTH 2 DAYS Performed at [facility], [address], [place], [place] [address]    Report Status PENDING  Incomplete  Blood Culture (routine x 2)     Status: None (Preliminary result)   Collection Time: 07/27/24  4:38 PM   Specimen: BLOOD  Result Value Ref Range Status   Specimen Description BLOOD LEFT ANTECUBITAL  Final   Special Requests   Final    BOTTLES DRAWN AEROBIC AND ANAEROBIC Blood Culture adequate volume   Culture   Final    NO GROWTH 2 DAYS Performed at [facility], [address], [place], [place] [address]    Report Status PENDING  Incomplete     Labs: BNP (last 3 results) No results for input(s): BNP in the last 8760 hours. Basic Metabolic Panel: Recent Labs  Lab 07/24/24 1608 07/27/24 1545 07/28/24 0428  NA 137 137 136  K 4.1 4.0 4.5  CL 104 101 101  CO2 24 25 26   GLUCOSE 122* 121* 94  BUN 14 16 14   CREATININE 1.15 1.17 1.21  CALCIUM 9.1 9.1 9.1   Liver Function Tests: No results for input(s): AST, ALT, ALKPHOS, BILITOT, PROT, ALBUMIN in the last 168 hours. No results for input(s): LIPASE, AMYLASE in the last 168 hours. No results for input(s): AMMONIA in the last 168 hours. CBC: Recent Labs  Lab 07/24/24 1608 07/27/24 1545 07/28/24 0428 07/29/24 0329  WBC 7.5 7.9 9.2 6.9  HGB 15.2 15.5 14.9 14.7  HCT 45.6 47.2 46.1 45.9  MCV 93.4 92.9 94.5 94.3  PLT 265 286 283 281   Cardiac Enzymes: No results for input(s): CKTOTAL, CKMB, CKMBINDEX, TROPONINI in the last 168 hours. BNP: Invalid input(s): POCBNP CBG: No results for input(s): GLUCAP in the last 168 hours. D-Dimer No results for input(s): DDIMER in the last 72 hours. Hgb A1c No results for input(s): HGBA1C in the last 72 hours. Lipid Profile No results for input(s): CHOL, HDL, LDLCALC, TRIG, CHOLHDL, LDLDIRECT in the last 72 hours. Thyroid function studies No results for input(s): TSH, T4TOTAL,  T3FREE, THYROIDAB in the last 72 hours.  Invalid input(s): FREET3 Anemia work up No results for input(s): VITAMINB12, FOLATE, FERRITIN, TIBC, IRON, RETICCTPCT in the last 72 hours. Urinalysis    Component Value Date/Time   COLORURINE Amber 09/25/2013 0850   APPEARANCEUR Hazy 09/25/2013 0850   LABSPEC 1.029 09/25/2013 0850   PHURINE 5.0 09/25/2013 0850   GLUCOSEU Negative 09/25/2013 0850   HGBUR Negative 09/25/2013 0850   BILIRUBINUR Negative 09/25/2013 0850   KETONESUR Negative 09/25/2013 0850   PROTEINUR 30 mg/dL 96/77/7984 9149   NITRITE Negative 09/25/2013 0850   LEUKOCYTESUR Negative 09/25/2013 0850   Sepsis Labs Recent Labs  Lab 07/24/24 1608 07/27/24 1545 07/28/24 0428 07/29/24 0329  WBC 7.5 7.9 9.2 6.9   Microbiology Recent Results (from the past 240 hours)  Resp panel by RT-PCR (RSV,  Flu A&B, Covid) Anterior Nasal Swab     Status: None   Collection Time: 07/24/24  4:54 PM   Specimen: Anterior Nasal Swab  Result Value Ref Range Status   SARS Coronavirus 2 by RT PCR NEGATIVE NEGATIVE Final    Comment: (NOTE) SARS-CoV-2 target nucleic acids are NOT DETECTED.  The SARS-CoV-2 RNA is generally detectable in upper respiratory specimens during the acute phase of infection. The lowest concentration of SARS-CoV-2 viral copies this assay can detect is 138 copies/mL. A negative result does not preclude SARS-Cov-2 infection and should not be used as the sole basis for treatment or other patient management decisions. A negative result may occur with  improper specimen collection/handling, submission of specimen other than nasopharyngeal swab, presence of viral mutation(s) within the areas targeted by this assay, and inadequate number of viral copies(<138 copies/mL). A negative result must be combined with clinical observations, patient history, and epidemiological information. The expected result is Negative.  Fact Sheet for Patients:   bloggercourse.com  Fact Sheet for Healthcare Providers:  seriousbroker.it  This test is no t yet approved or cleared by the [place]  FDA and  has been authorized for detection and/or diagnosis of SARS-CoV-2 by FDA under an Emergency Use Authorization (EUA). This EUA will remain  in effect (meaning this test can be used) for the duration of the COVID-19 declaration under Section 564(b)(1) of the Act, 21 U.S.C.section 360bbb-3(b)(1), unless the authorization is terminated  or revoked sooner.       Influenza A by PCR NEGATIVE NEGATIVE Final   Influenza B by PCR NEGATIVE NEGATIVE Final    Comment: (NOTE) The Xpert Xpress SARS-CoV-2/FLU/RSV plus assay is intended as an aid in the diagnosis of influenza from Nasopharyngeal swab specimens and should not be used as a sole basis for treatment. Nasal washings and aspirates are unacceptable for Xpert Xpress SARS-CoV-2/FLU/RSV testing.  Fact Sheet for Patients: bloggercourse.com  Fact Sheet for Healthcare Providers: seriousbroker.it  This test is not yet approved or cleared by the [place]  FDA and has been authorized for detection and/or diagnosis of SARS-CoV-2 by FDA under an Emergency Use Authorization (EUA). This EUA will remain in effect (meaning this test can be used) for the duration of the COVID-19 declaration under Section 564(b)(1) of the Act, 21 U.S.C. section 360bbb-3(b)(1), unless the authorization is terminated or revoked.     Resp Syncytial Virus by PCR NEGATIVE NEGATIVE Final    Comment: (NOTE) Fact Sheet for Patients: bloggercourse.com  Fact Sheet for Healthcare Providers: seriousbroker.it  This test is not yet approved or cleared by the [place]  FDA and has been authorized for detection and/or diagnosis of SARS-CoV-2 by FDA under an Emergency Use  Authorization (EUA). This EUA will remain in effect (meaning this test can be used) for the duration of the COVID-19 declaration under Section 564(b)(1) of the Act, 21 U.S.C. section 360bbb-3(b)(1), unless the authorization is terminated or revoked.  Performed at [facility], [address] [place], [place] [address]   Resp panel by RT-PCR (RSV, Flu A&B, Covid) Anterior Nasal Swab     Status: None   Collection Time: 07/27/24  4:38 PM   Specimen: Anterior Nasal Swab  Result Value Ref Range Status   SARS Coronavirus 2 by RT PCR NEGATIVE NEGATIVE Final    Comment: (NOTE) SARS-CoV-2 target nucleic acids are NOT DETECTED.  The SARS-CoV-2 RNA is generally detectable in upper respiratory specimens during the acute phase of infection. The lowest concentration of SARS-CoV-2 viral copies this  assay can detect is 138 copies/mL. A negative result does not preclude SARS-Cov-2 infection and should not be used as the sole basis for treatment or other patient management decisions. A negative result may occur with  improper specimen collection/handling, submission of specimen other than nasopharyngeal swab, presence of viral mutation(s) within the areas targeted by this assay, and inadequate number of viral copies(<138 copies/mL). A negative result must be combined with clinical observations, patient history, and epidemiological information. The expected result is Negative.  Fact Sheet for Patients:  bloggercourse.com  Fact Sheet for Healthcare Providers:  seriousbroker.it  This test is no t yet approved or cleared by the [place]  FDA and  has been authorized for detection and/or diagnosis of SARS-CoV-2 by FDA under an Emergency Use Authorization (EUA). This EUA will remain  in effect (meaning this test can be used) for the duration of the COVID-19 declaration under Section 564(b)(1) of the Act, 21 U.S.C.section 360bbb-3(b)(1),  unless the authorization is terminated  or revoked sooner.       Influenza A by PCR NEGATIVE NEGATIVE Final   Influenza B by PCR NEGATIVE NEGATIVE Final    Comment: (NOTE) The Xpert Xpress SARS-CoV-2/FLU/RSV plus assay is intended as an aid in the diagnosis of influenza from Nasopharyngeal swab specimens and should not be used as a sole basis for treatment. Nasal washings and aspirates are unacceptable for Xpert Xpress SARS-CoV-2/FLU/RSV testing.  Fact Sheet for Patients: bloggercourse.com  Fact Sheet for Healthcare Providers: seriousbroker.it  This test is not yet approved or cleared by the [place]  FDA and has been authorized for detection and/or diagnosis of SARS-CoV-2 by FDA under an Emergency Use Authorization (EUA). This EUA will remain in effect (meaning this test can be used) for the duration of the COVID-19 declaration under Section 564(b)(1) of the Act, 21 U.S.C. section 360bbb-3(b)(1), unless the authorization is terminated or revoked.     Resp Syncytial Virus by PCR NEGATIVE NEGATIVE Final    Comment: (NOTE) Fact Sheet for Patients: bloggercourse.com  Fact Sheet for Healthcare Providers: seriousbroker.it  This test is not yet approved or cleared by the [place]  FDA and has been authorized for detection and/or diagnosis of SARS-CoV-2 by FDA under an Emergency Use Authorization (EUA). This EUA will remain in effect (meaning this test can be used) for the duration of the COVID-19 declaration under Section 564(b)(1) of the Act, 21 U.S.C. section 360bbb-3(b)(1), unless the authorization is terminated or revoked.  Performed at [facility], [address] [place], [place] [address]   Blood Culture (routine x 2)     Status: None (Preliminary result)   Collection Time: 07/27/24  4:38 PM   Specimen: BLOOD  Result Value Ref Range Status    Specimen Description BLOOD BLOOD LEFT ARM  Final   Special Requests   Final    BOTTLES DRAWN AEROBIC AND ANAEROBIC Blood Culture adequate volume   Culture   Final    NO GROWTH 2 DAYS Performed at [facility], [address] [place], [place] [address]    Report Status PENDING  Incomplete  Blood Culture (routine x 2)     Status: None (Preliminary result)   Collection Time: 07/27/24  4:38 PM   Specimen: BLOOD  Result Value Ref Range Status   Specimen Description BLOOD LEFT ANTECUBITAL  Final   Special Requests   Final    BOTTLES DRAWN AEROBIC AND ANAEROBIC Blood Culture adequate volume   Culture   Final    NO GROWTH 2  DAYS Performed at [facility], [address] [place], [place] [address]    Report Status PENDING  Incomplete     Time coordinating discharge: 40 minutes  SIGNED:   Calvin KATHEE Robson, MD  [facility] 07/29/2024, 11:14 AM Pager   If 7PM-7AM, please contact night-coverage     [1] No Known Allergies

## 2024-07-29 NOTE — Progress Notes (Signed)
 PHARMACY - ANTICOAGULATION CONSULT NOTE  Pharmacy Consult for Heparin  Infusion Indication: pulmonary embolus  Allergies[1]  Patient Measurements: Height: 6' (182.9 cm) Weight: 104 kg (229 lb 4.5 oz) IBW/kg (Calculated) : 77.6 HEPARIN  DW (KG): 99.1  Labs: Recent Labs    07/27/24 1545 07/27/24 1545 07/28/24 0428 07/28/24 1247 07/28/24 2031 07/29/24 0329  HGB 15.5  --  14.9  --   --  14.7  HCT 47.2  --  46.1  --   --  45.9  PLT 286  --  283  --   --  281  APTT  --    < > 131* 108* 99* 99*  LABPROT 15.3*  --   --   --   --   --   INR 1.1  --   --   --   --   --   HEPARINUNFRC  --   --  >1.10*  --   --  0.73*  CREATININE 1.17  --  1.21  --   --   --    < > = values in this interval not displayed.    Estimated Creatinine Clearance: 100.2 mL/min (by C-G formula based on SCr of 1.21 mg/dL).   Medical History: History reviewed. No pertinent past medical history.  Medications:  Medications Prior to Admission  Medication Sig Dispense Refill Last Dose/Taking   APIXABAN  (ELIQUIS ) VTE STARTER PACK (10MG  AND 5MG ) Take as directed on package: start with two-5mg  tablets twice daily for 7 days. On day 8, switch to one-5mg  tablet twice daily. 74 each 0 07/27/2024 at 10:00 AM   levofloxacin  (LEVAQUIN ) 750 MG tablet Take 1 tablet (750 mg total) by mouth daily for 7 days. 7 tablet 0 07/26/2024   apixaban  (ELIQUIS ) 5 MG TABS tablet Take 1 tablet (5 mg total) by mouth 2 (two) times daily. 120 tablet 0     Assessment: 44 y/o M recently seen in the ED on 1/18 and discharged on apixaban  for PE now being evaluated in the ED for worsening chest pain and shortness of breath.   Last dose of apixaban  was this AM per patient (10 mg)  Goal of Therapy:  Heparin  level 0.3-0.7 units/ml aPTT 66-102 seconds Monitor platelets by anticoagulation protocol: Yes  0122 0428 aPTT 131, supratherapeutic / HL >1.1 0122 1247 aPTT 108, supratherapeutic 0122  2031 aPTT 99, therapeutic x 1 0123 0329 aPTT 99,  therapeutic x 2 / HL 0.73   Plan:  Continue heparin  infusion at 1300 units/hr Recheck aPTT level daily w/ AM labs while therapeutic Transition to anti-Xa levels when correlating with aPTT Daily CBC/anti-Xa  Rankin CANDIE Dills, PharmD, [facility] 07/29/2024 5:02 AM      [1] No Known Allergies

## 2024-08-01 LAB — CULTURE, BLOOD (ROUTINE X 2)
Culture: NO GROWTH
Culture: NO GROWTH
Special Requests: ADEQUATE
Special Requests: ADEQUATE

## 2024-08-09 ENCOUNTER — Ambulatory Visit: Admitting: Pulmonary Disease

## 2024-08-09 ENCOUNTER — Encounter: Payer: Self-pay | Admitting: Pulmonary Disease

## 2024-08-09 VITALS — BP 104/80 | HR 101 | Temp 97.6°F | Ht 72.0 in | Wt 232.0 lb

## 2024-08-09 DIAGNOSIS — D869 Sarcoidosis, unspecified: Secondary | ICD-10-CM | POA: Diagnosis not present

## 2024-08-09 DIAGNOSIS — R0602 Shortness of breath: Secondary | ICD-10-CM | POA: Diagnosis not present

## 2024-08-09 DIAGNOSIS — R918 Other nonspecific abnormal finding of lung field: Secondary | ICD-10-CM

## 2024-08-09 DIAGNOSIS — I2699 Other pulmonary embolism without acute cor pulmonale: Secondary | ICD-10-CM | POA: Diagnosis not present

## 2024-08-09 DIAGNOSIS — R590 Localized enlarged lymph nodes: Secondary | ICD-10-CM | POA: Diagnosis not present

## 2024-08-09 MED ORDER — APIXABAN 5 MG PO TABS: 5.0000 mg | ORAL_TABLET | Freq: Two times a day (BID) | ORAL | 5 refills | Status: AC

## 2024-08-09 NOTE — Patient Instructions (Signed)
 VISIT SUMMARY:  During your visit, we discussed your recent hospitalization for a pulmonary embolism and bilateral pulmonary infiltrates. You continue to experience chest pain and shortness of breath, which are being monitored closely. We also discussed your work environment and smoking habits.  YOUR PLAN:  -PULMONARY EMBOLISM OF RIGHT UPPER LOBE: A pulmonary embolism is a blood clot that travels to the lungs. You were hospitalized for a small clot in the upper part of your right lung. You should continue taking your blood thinners for six months. We will do a follow-up CT scan in a few weeks to check if the clot has resolved. Avoid heavy lifting and dusty environments for at least another week.  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 Your blood work suggests you might have this condition. We need to do more tests, including pulmonary function tests and a follow-up CT scan, to confirm the diagnosis. If confirmed, we may start treatment with prednisone , a medication that reduces inflammation.  INSTRUCTIONS:  Please continue taking your blood thinners as prescribed. Avoid heavy lifting and dusty environments for at least another week. We will schedule a follow-up CT scan in a few weeks to check the status of your pulmonary embolism. Additionally, we will conduct pulmonary function tests to further evaluate your lung condition. If you have any new or worsening symptoms, please contact our office immediately.

## 2024-08-29 ENCOUNTER — Ambulatory Visit

## 2024-09-30 ENCOUNTER — Encounter

## 2024-09-30 ENCOUNTER — Ambulatory Visit: Admitting: Pulmonary Disease
